# Patient Record
Sex: Male | Born: 1965 | Race: White | Hispanic: No | Marital: Married | Smoking: Former smoker
Health system: Southern US, Community
[De-identification: ages and names within clinical notes are randomized; demographics above are authoritative.]

## PROBLEM LIST (undated history)

## (undated) DIAGNOSIS — J449 Chronic obstructive pulmonary disease, unspecified: Secondary | ICD-10-CM

## (undated) DIAGNOSIS — K219 Gastro-esophageal reflux disease without esophagitis: Secondary | ICD-10-CM

## (undated) DIAGNOSIS — R0602 Shortness of breath: Secondary | ICD-10-CM

## (undated) DIAGNOSIS — Z87891 Personal history of nicotine dependence: Secondary | ICD-10-CM

## (undated) DIAGNOSIS — G43909 Migraine, unspecified, not intractable, without status migrainosus: Secondary | ICD-10-CM

## (undated) DIAGNOSIS — Z8249 Family history of ischemic heart disease and other diseases of the circulatory system: Secondary | ICD-10-CM

## (undated) DIAGNOSIS — M549 Dorsalgia, unspecified: Secondary | ICD-10-CM

## (undated) DIAGNOSIS — R911 Solitary pulmonary nodule: Secondary | ICD-10-CM

## (undated) DIAGNOSIS — R0600 Dyspnea, unspecified: Secondary | ICD-10-CM

## (undated) DIAGNOSIS — I2510 Atherosclerotic heart disease of native coronary artery without angina pectoris: Secondary | ICD-10-CM

## (undated) DIAGNOSIS — K661 Hemoperitoneum: Secondary | ICD-10-CM

## (undated) DIAGNOSIS — I700 Atherosclerosis of aorta: Secondary | ICD-10-CM

## (undated) HISTORY — DX: Gastro-esophageal reflux disease without esophagitis: K21.9

## (undated) HISTORY — DX: Hemoperitoneum: K66.1

## (undated) HISTORY — DX: Atherosclerosis of aorta: I70.0

## (undated) HISTORY — DX: Dyspnea, unspecified: R06.00

## (undated) HISTORY — DX: Family history of ischemic heart disease and other diseases of the circulatory system: Z82.49

## (undated) HISTORY — DX: Atherosclerotic heart disease of native coronary artery without angina pectoris: I25.10

## (undated) HISTORY — DX: Migraine, unspecified, not intractable, without status migrainosus: G43.909

## (undated) HISTORY — DX: Solitary pulmonary nodule: R91.1

## (undated) HISTORY — DX: Chronic obstructive pulmonary disease, unspecified: J44.9

## (undated) HISTORY — DX: Personal history of nicotine dependence: Z87.891

## (undated) HISTORY — DX: Shortness of breath: R06.02

---

## 2005-07-27 ENCOUNTER — Emergency Department (HOSPITAL_COMMUNITY): Admission: EM | Admit: 2005-07-27 | Discharge: 2005-07-27 | Payer: Self-pay | Admitting: Family Medicine

## 2009-07-02 HISTORY — PX: INGUINAL HERNIA REPAIR: SUR1180

## 2009-12-15 ENCOUNTER — Emergency Department (HOSPITAL_COMMUNITY): Admission: EM | Admit: 2009-12-15 | Discharge: 2009-12-15 | Payer: Self-pay | Admitting: Emergency Medicine

## 2009-12-20 ENCOUNTER — Encounter (INDEPENDENT_AMBULATORY_CARE_PROVIDER_SITE_OTHER): Payer: Self-pay | Admitting: General Surgery

## 2009-12-20 ENCOUNTER — Inpatient Hospital Stay (HOSPITAL_COMMUNITY): Admission: AD | Admit: 2009-12-20 | Discharge: 2009-12-22 | Payer: Self-pay | Admitting: General Surgery

## 2010-09-17 LAB — DIFFERENTIAL
Basophils Absolute: 0 10*3/uL (ref 0.0–0.1)
Monocytes Absolute: 0.8 10*3/uL (ref 0.1–1.0)
Monocytes Relative: 10 % (ref 3–12)
Neutrophils Relative %: 81 % — ABNORMAL HIGH (ref 43–77)

## 2010-09-17 LAB — CBC
HCT: 40.6 % (ref 39.0–52.0)
Hemoglobin: 13.6 g/dL (ref 13.0–17.0)
Hemoglobin: 14.8 g/dL (ref 13.0–17.0)
MCHC: 34.5 g/dL (ref 30.0–36.0)
Platelets: 198 10*3/uL (ref 150–400)
Platelets: 363 10*3/uL (ref 150–400)
RBC: 4.48 MIL/uL (ref 4.22–5.81)
RBC: 4.93 MIL/uL (ref 4.22–5.81)
RDW: 12.9 % (ref 11.5–15.5)
WBC: 11 10*3/uL — ABNORMAL HIGH (ref 4.0–10.5)
WBC: 5.2 10*3/uL (ref 4.0–10.5)
WBC: 7.7 10*3/uL (ref 4.0–10.5)

## 2010-09-17 LAB — BASIC METABOLIC PANEL
BUN: 9 mg/dL (ref 6–23)
CO2: 30 mEq/L (ref 19–32)
Calcium: 9.2 mg/dL (ref 8.4–10.5)
Calcium: 9.3 mg/dL (ref 8.4–10.5)
Creatinine, Ser: 0.95 mg/dL (ref 0.4–1.5)
Creatinine, Ser: 1.14 mg/dL (ref 0.4–1.5)
GFR calc Af Amer: >60 mL/min (ref >60)
GFR calc Af Amer: >60 mL/min (ref >60)
GFR calc non Af Amer: >60 mL/min (ref >60)
Glucose, Bld: 102 mg/dL — ABNORMAL HIGH (ref 70–99)
Glucose, Bld: 108 mg/dL — ABNORMAL HIGH (ref 70–99)
Glucose, Bld: 110 mg/dL — ABNORMAL HIGH (ref 70–99)
Potassium: 4.5 mEq/L (ref 3.5–5.1)
Potassium: 4.8 mEq/L (ref 3.5–5.1)

## 2010-09-17 LAB — ANAEROBIC CULTURE: Gram Stain: NONE SEEN

## 2010-09-17 LAB — BODY FLUID CULTURE: Culture: NO GROWTH

## 2011-05-30 LAB — CBC
HEMOGLOBIN: 14.5 g/dL
PLATELET COUNT: 241
WBC: 5.6

## 2011-05-30 LAB — COMPREHENSIVE METABOLIC PANEL
ALT: 25
AST: 19 U/L
Alkaline Phosphatase: 51 U/L
BILIRUBIN: 0.9
CREATININE: 1.12
GLUCOSE: 76
Potassium: 4.4 mmol/L
SODIUM: 140

## 2011-05-30 LAB — LIPID PANEL
CHOLESTEROL, TOTAL: 174
HDL: 37 mg/dL (ref 35–70)
LDL (calc): 116
TRIGLYCERIDES: 107

## 2011-05-30 LAB — TSH
Free T4: 2.9
TSH: 1.288

## 2011-05-30 LAB — PSA: PSA: 0.48

## 2011-05-30 LAB — VITAMIN D 25 HYDROXY (VIT D DEFICIENCY, FRACTURES): Vit D, 25-Hydroxy: 70

## 2014-09-17 ENCOUNTER — Ambulatory Visit (INDEPENDENT_AMBULATORY_CARE_PROVIDER_SITE_OTHER): Payer: BLUE CROSS/BLUE SHIELD | Admitting: Family Medicine

## 2014-09-17 ENCOUNTER — Encounter: Payer: Self-pay | Admitting: Family Medicine

## 2014-09-17 VITALS — BP 122/82 | HR 64 | Temp 97.9°F | Ht 74.0 in | Wt 228.8 lb

## 2014-09-17 DIAGNOSIS — J209 Acute bronchitis, unspecified: Secondary | ICD-10-CM | POA: Insufficient documentation

## 2014-09-17 DIAGNOSIS — G43909 Migraine, unspecified, not intractable, without status migrainosus: Secondary | ICD-10-CM

## 2014-09-17 DIAGNOSIS — L918 Other hypertrophic disorders of the skin: Secondary | ICD-10-CM

## 2014-09-17 DIAGNOSIS — K219 Gastro-esophageal reflux disease without esophagitis: Secondary | ICD-10-CM | POA: Insufficient documentation

## 2014-09-17 DIAGNOSIS — M546 Pain in thoracic spine: Secondary | ICD-10-CM

## 2014-09-17 MED ORDER — NAPROXEN 500 MG PO TABS: ORAL_TABLET | ORAL | Status: DC

## 2014-09-17 MED ORDER — TRAMADOL HCL 50 MG PO TABS: 50.0000 mg | ORAL_TABLET | Freq: Three times a day (TID) | ORAL | Status: DC | PRN

## 2014-09-17 MED ORDER — AZITHROMYCIN 250 MG PO TABS: ORAL_TABLET | ORAL | Status: DC

## 2014-09-17 MED ORDER — SUMATRIPTAN SUCCINATE 100 MG PO TABS: 100.0000 mg | ORAL_TABLET | ORAL | Status: DC | PRN

## 2014-09-17 MED ORDER — RANITIDINE HCL 150 MG PO TABS: 150.0000 mg | ORAL_TABLET | Freq: Every day | ORAL | Status: DC

## 2014-09-17 NOTE — Assessment & Plan Note (Signed)
Continue ranitidine prn. Discussed diet changes to control GERD sxs.

## 2014-09-17 NOTE — Progress Notes (Signed)
BP 122/82 mmHg  Pulse 64  Temp(Src) 97.9 F (36.6 C) (Oral)  Ht 6\' 2"  (1.88 m)  Wt 228 lb 12 oz (103.76 kg)  BMI 29.36 kg/m2   CC: new pt to establish care  Subjective:    Patient ID: David Moss, male    DOB: 10/10/1965, 49 y.o.   MRN: [id number]  HPI: David Moss is a 49 y.o. male presenting on 09/17/2014 for Establish Care   Prior saw Dr Derry Lory who moved to [place].  GERD with indigestion - on zantac 150mg  nightly as needed which controls sxs well.   Migraines - once a month. imitrex helps significantly.   Thoracic back pain - rare use of percocets for thoracic back pain prescribed by prior PCP. Back bothersome about once a month. Ibuporfen didn't really help back pain.  Ongoing cold for last 3.5 weeks - chest congestion, cough mildly productive of mucous, sinus congestion, PNdrainage. No fevers/chills, ear pain, tooth pain, ST. No smokers at home. No h/o asthma.  Wife and daughter initially better. Has tried mucinex DM x2 packs, delsym, sinus decongestant, liquor.   Also has several skin tags - axilla, around eyes, one on back - that he wants to have removed. Requests derm referral.  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 Interim medical history since our last visit reviewed. Allergies and medications reviewed and updated. No current outpatient prescriptions on file prior to visit.   No current facility-administered medications on file prior to visit.    Review of Systems Per HPI unless specifically indicated above     Objective:    BP 122/82 mmHg  Pulse 64  Temp(Src) 97.9 F (36.6 C) (Oral)  Ht 6\' 2"  (1.88 m)  Wt 228 lb 12 oz (103.76 kg)  BMI 29.36 kg/m2  Wt Readings from Last 3 Encounters:  09/17/14  228 lb 12 oz (103.76 kg)    Physical Exam  Constitutional: He appears well-developed and well-nourished. No distress.  HENT:  Mouth/Throat: Oropharynx is clear and moist. No oropharyngeal exudate.  Eyes: Conjunctivae are normal. Pupils are equal, round, and reactive to light.  Neck: Normal range of motion. Neck supple. No thyromegaly present.  Cardiovascular: Normal rate, regular rhythm, normal heart sounds and intact distal pulses.   No murmur heard. Pulmonary/Chest: Effort normal and breath sounds normal. No respiratory distress. He has no wheezes. He has no rales.  Musculoskeletal: He exhibits no edema.  No midline or paraspinal tenderness thoracic back  Lymphadenopathy:    He has no cervical adenopathy.  Skin: Skin is warm and dry. No rash noted.  Several skin tags axillary region, miniscule tag R lateral and nasal periorbital region One larger skin growth on back  Psychiatric: He has a normal mood and affect.  Nursing note and vitals reviewed.     Assessment & Plan:   Problem List Items Addressed This Visit    Thoracic back pain    Endorses chronic issue with thoracic back pain that causes intermittent flares of pain. Prescribed percocet by prior PCP. Exam WNL today. Discussed naprosyn use with tramadol for breakthrough pain at next flare. Consider further eval with imaging if persistent discomfort.      Relevant Medications   naproxen (NAPROSYN) tablet   traMADol (ULTRAM) tablet 50 mg  Skin tag    Given tags around eyes will refer to derm for eval. 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      Relevant Orders   Ambulatory referral to Dermatology   Migraine    Refilled sumatriptan.      Relevant Medications   SUMAtriptan (IMITREX) tablet   naproxen (NAPROSYN) tablet   traMADol (ULTRAM) tablet 50 mg   GERD (gastroesophageal reflux disease) - Primary    Continue ranitidine prn. Discussed diet changes to control GERD sxs.      Relevant Medications   ranitidine (ZANTAC)  tablet   Acute bronchitis    Ongoing cough for 3.5 wks - post infectious cough vs bronchitis. Still mildly productive of sputum, lung exam with harsh coarse cough. Cover for atypicals with zpack.  Supportive care as per instructions.           Follow up plan: Return in about 3 months (around 12/18/2014), or as needed, for annual exam, prior fasting for blood work.

## 2014-09-17 NOTE — Assessment & Plan Note (Signed)
Endorses chronic issue with thoracic back pain that causes intermittent flares of pain. Prescribed percocet by prior PCP. Exam WNL today. Discussed naprosyn use with tramadol for breakthrough pain at next flare. Consider further eval with imaging if persistent discomfort.

## 2014-09-17 NOTE — Assessment & Plan Note (Signed)
Refilled sumatriptan 

## 2014-09-17 NOTE — Patient Instructions (Addendum)
I've refilled zantac and imitrex. For thoracic back pain - try naprosyn (with food) and tramadol for breakthrough pain.  Let us know how this works. For cough - I think you have bronchitis. treat with zpack. You can also use naprosyn prescribed as anti inflammatory for cough. Push fluids and rest. Return as needed or in 3-4 months for annual exam , prior fasting for blood work

## 2014-09-17 NOTE — Progress Notes (Signed)
Pre visit review using our clinic review tool, if applicable. No additional management support is needed unless otherwise documented below in the visit note. 

## 2014-09-17 NOTE — Assessment & Plan Note (Signed)
Given tags around eyes will refer to derm for eval. 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

## 2014-09-17 NOTE — Assessment & Plan Note (Signed)
Ongoing cough for 3.5 wks - post infectious cough vs bronchitis. Still mildly productive of sputum, lung exam with harsh coarse cough. Cover for atypicals with zpack.  Supportive care as per instructions.

## 2014-10-01 ENCOUNTER — Telehealth: Payer: Self-pay | Admitting: *Deleted

## 2014-10-01 MED ORDER — AMOXICILLIN-POT CLAVULANATE 875-125 MG PO TABS: 1.0000 | ORAL_TABLET | Freq: Two times a day (BID) | ORAL | Status: AC

## 2014-10-01 NOTE — Telephone Encounter (Signed)
Pt states he was seen 2 weeks ago for chest congestion, and given z pak. He said he's "not any better and now it's all moved up into my head". He c/o sinus pressure, headache, productive cough- yellow sputum, and a sore throat, nut denies running a fever. He request a different abx be called in to [facility]. Please advise?

## 2014-10-01 NOTE — Telephone Encounter (Signed)
Patient notified and verbalized understanding. 

## 2014-10-01 NOTE — Telephone Encounter (Signed)
Would recommend treatment with augmentin course to cover sinusitis. Given how long sxs ongoing, recommend extended 2 wk course. Update if not improved after this.

## 2014-10-12 ENCOUNTER — Other Ambulatory Visit: Payer: Self-pay | Admitting: Dermatology

## 2014-11-10 ENCOUNTER — Encounter: Payer: Self-pay | Admitting: *Deleted

## 2014-12-31 ENCOUNTER — Other Ambulatory Visit (INDEPENDENT_AMBULATORY_CARE_PROVIDER_SITE_OTHER): Payer: BLUE CROSS/BLUE SHIELD

## 2014-12-31 ENCOUNTER — Other Ambulatory Visit: Payer: Self-pay | Admitting: Family Medicine

## 2014-12-31 DIAGNOSIS — Z131 Encounter for screening for diabetes mellitus: Secondary | ICD-10-CM | POA: Diagnosis not present

## 2014-12-31 DIAGNOSIS — Z13220 Encounter for screening for lipoid disorders: Secondary | ICD-10-CM | POA: Diagnosis not present

## 2014-12-31 DIAGNOSIS — G43909 Migraine, unspecified, not intractable, without status migrainosus: Secondary | ICD-10-CM

## 2014-12-31 LAB — BASIC METABOLIC PANEL
BUN: 18 mg/dL (ref 6–23)
CHLORIDE: 103 meq/L (ref 96–112)
CO2: 28 mEq/L (ref 19–32)
Calcium: 9.3 mg/dL (ref 8.4–10.5)
Creatinine, Ser: 1.21 mg/dL (ref 0.40–1.50)
GFR: 68.3 mL/min (ref >60.00)
Glucose, Bld: 95 mg/dL (ref 70–99)
POTASSIUM: 4 meq/L (ref 3.5–5.1)
SODIUM: 140 meq/L (ref 135–145)

## 2014-12-31 LAB — LIPID PANEL
Cholesterol: 175 mg/dL (ref 0–200)
HDL: 47.9 mg/dL (ref >39.00)
LDL Cholesterol: 118 mg/dL — ABNORMAL HIGH (ref 0–99)
NonHDL: 127.1
Total CHOL/HDL Ratio: 4
Triglycerides: 47 mg/dL (ref 0.0–149.0)
VLDL: 9.4 mg/dL (ref 0.0–40.0)

## 2014-12-31 LAB — TSH: TSH: 1.39 u[IU]/mL (ref 0.35–4.50)

## 2015-01-07 ENCOUNTER — Ambulatory Visit (INDEPENDENT_AMBULATORY_CARE_PROVIDER_SITE_OTHER): Payer: BLUE CROSS/BLUE SHIELD | Admitting: Family Medicine

## 2015-01-07 ENCOUNTER — Encounter: Payer: Self-pay | Admitting: Family Medicine

## 2015-01-07 VITALS — BP 110/84 | HR 60 | Temp 98.0°F | Ht 74.0 in | Wt 231.0 lb

## 2015-01-07 DIAGNOSIS — K219 Gastro-esophageal reflux disease without esophagitis: Secondary | ICD-10-CM | POA: Diagnosis not present

## 2015-01-07 DIAGNOSIS — Z0000 Encounter for general adult medical examination without abnormal findings: Secondary | ICD-10-CM | POA: Diagnosis not present

## 2015-01-07 DIAGNOSIS — Z23 Encounter for immunization: Secondary | ICD-10-CM | POA: Diagnosis not present

## 2015-01-07 DIAGNOSIS — R2240 Localized swelling, mass and lump, unspecified lower limb: Secondary | ICD-10-CM | POA: Insufficient documentation

## 2015-01-07 DIAGNOSIS — R2241 Localized swelling, mass and lump, right lower limb: Secondary | ICD-10-CM | POA: Diagnosis not present

## 2015-01-07 NOTE — Progress Notes (Signed)
Pre visit review using our clinic review tool, if applicable. No additional management support is needed unless otherwise documented below in the visit note. 

## 2015-01-07 NOTE — Assessment & Plan Note (Signed)
Preventative protocols reviewed and updated unless pt declined. Discussed healthy diet and lifestyle.  

## 2015-01-07 NOTE — Assessment & Plan Note (Signed)
Prn zantac

## 2015-01-07 NOTE — Assessment & Plan Note (Signed)
Anticipate lipoma. Pt will continue to monitor.

## 2015-01-07 NOTE — Patient Instructions (Addendum)
Tdap today Good to see you today, call us with questions. Return as needed or in 1 year for next physical.

## 2015-01-07 NOTE — Addendum Note (Signed)
Addended by: Royann Shivers A on: 01/07/2015 09:23 AM   Modules accepted: Orders

## 2015-01-07 NOTE — Progress Notes (Signed)
BP 110/84 mmHg  Pulse 60  Temp(Src) 98 F (36.7 C) (Oral)  Ht 6\' 2"  (1.88 m)  Wt 231 lb (104.781 kg)  BMI 29.65 kg/m2   CC: CPE  Subjective:    Patient ID: David Moss, male    DOB: 07-19-1965, 49 y.o.   MRN: [id number]  HPI: David Moss is a 49 y.o. male presenting on 01/07/2015 for Annual Exam   Some chronic thoracic pain attributed to arthritis and possible pinching nerve. On naprosyn and tramadol for this.   Knot on leg which may be enlarging. Not tender or itchy. Present for 1.5 years.   Preventative: No recent CPE Gets flu shot yearly. Tetanus - >10 yrs ago, requests today Seat belt use discussed Sunscreen use discussed. No changing moles on skin.  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 Interim medical history since our last visit reviewed. Allergies and medications reviewed and updated. Current Outpatient Prescriptions on File Prior to Visit  Medication Sig  . naproxen (NAPROSYN) 500 MG tablet Take one po bid x 1 week then prn pain, take with food  . ranitidine (ZANTAC) 150 MG tablet Take 1 tablet (150 mg total) by mouth at bedtime.  . SUMAtriptan (IMITREX) 100 MG tablet Take 1 tablet (100 mg total) by mouth every 2 (two) hours as needed for migraine. May repeat in 2 hours if headache persists or recurs.  . traMADol (ULTRAM) 50 MG tablet Take 1 tablet (50 mg total) by mouth every 8 (eight) hours as needed.   No current facility-administered medications on file prior to visit.    Review of Systems  Constitutional: Negative for fever, chills, activity change, appetite change, fatigue and unexpected weight change.  HENT: Negative for hearing loss.   Eyes: Negative for visual disturbance.  Respiratory: Negative for cough, chest tightness,  shortness of breath and wheezing.   Cardiovascular: Negative for chest pain, palpitations and leg swelling.  Gastrointestinal: Negative for nausea, vomiting, abdominal pain, diarrhea, constipation, blood in stool and abdominal distention.  Genitourinary: Negative for hematuria and difficulty urinating.  Musculoskeletal: Negative for myalgias, arthralgias and neck pain.  Skin: Negative for rash.  Neurological: Negative for dizziness, seizures, syncope and headaches.  Hematological: Negative for adenopathy. Does not bruise/bleed easily.  Psychiatric/Behavioral: Negative for dysphoric mood. The patient is not nervous/anxious.    Per HPI unless specifically indicated above     Objective:    BP 110/84 mmHg  Pulse 60  Temp(Src) 98 F (36.7 C) (Oral)  Ht 6\' 2"  (1.88 m)  Wt 231 lb (104.781 kg)  BMI 29.65 kg/m2  Wt Readings from Last 3 Encounters:  01/07/15 231 lb (104.781 kg)  09/17/14 228 lb 12 oz (103.76 kg)    Physical Exam  Constitutional: He is oriented to person, place, and time. He appears well-developed and well-nourished. No distress.  HENT:  Head: Normocephalic and atraumatic.  Right Ear: Hearing, tympanic membrane, external ear and ear canal normal.  Left Ear: Hearing, tympanic membrane, external ear and ear canal normal.  Nose: Nose normal.  Mouth/Throat: Uvula is midline, oropharynx is clear and moist and mucous membranes are normal. No oropharyngeal exudate, posterior oropharyngeal edema or posterior oropharyngeal erythema.  Eyes: Conjunctivae and EOM are normal. Pupils are equal, round, and reactive to light. No scleral icterus.  Neck: Normal range  of motion. Neck supple.  Cardiovascular: Normal rate, regular rhythm, normal heart sounds and intact distal pulses.   No murmur heard. Pulses:      Radial pulses are 2+ on the right side, and 2+ on the left side.  Pulmonary/Chest: Effort normal and breath sounds normal. No respiratory distress. He has no wheezes. He has no  rales.  Abdominal: Soft. Bowel sounds are normal. He exhibits no distension and no mass. There is no tenderness. There is no rebound and no guarding.  Musculoskeletal: Normal range of motion. He exhibits no edema.  Lymphadenopathy:    He has no cervical adenopathy.  Neurological: He is alert and oriented to person, place, and time.  CN grossly intact, station and gait intact  Skin: Skin is warm and dry. No rash noted.  ~2cm small firm mobile lump R anterior upper thigh, nontender or erythematous or fluctuant  Psychiatric: He has a normal mood and affect. His behavior is normal. Judgment and thought content normal.  Nursing note and vitals reviewed.  Results for orders placed or performed in visit on 12/31/14  Lipid panel  Result Value Ref Range   Cholesterol 175 0 - 200 mg/dL   Triglycerides 47.0 0.0 - 149.0 mg/dL   HDL 47.90 >39.00 mg/dL   VLDL 9.4 0.0 - 40.0 mg/dL   LDL Cholesterol 118 (H) 0 - 99 mg/dL   Total CHOL/HDL Ratio 4    NonHDL 938.18   Basic metabolic panel  Result Value Ref Range   Sodium 140 135 - 145 mEq/L   Potassium 4.0 3.5 - 5.1 mEq/L   Chloride 103 96 - 112 mEq/L   CO2 28 19 - 32 mEq/L   Glucose, Bld 95 70 - 99 mg/dL   BUN 18 6 - 23 mg/dL   Creatinine, Ser 1.21 0.40 - 1.50 mg/dL   Calcium 9.3 8.4 - 10.5 mg/dL   GFR 68.30 >60.00 mL/min  TSH  Result Value Ref Range   TSH 1.39 0.35 - 4.50 uIU/mL      Assessment & Plan:   Problem List Items Addressed This Visit    GERD (gastroesophageal reflux disease)    Prn zantac.      Health maintenance examination - Primary    Preventative protocols reviewed and updated unless pt declined. Discussed healthy diet and lifestyle.       Skin lump of leg    Anticipate lipoma. Pt will continue to monitor.          Follow up plan: Return in about 1 year (around 01/07/2016), or as needed, for annual exam, prior fasting for blood work.

## 2015-03-14 ENCOUNTER — Ambulatory Visit (INDEPENDENT_AMBULATORY_CARE_PROVIDER_SITE_OTHER): Payer: BLUE CROSS/BLUE SHIELD | Admitting: Family Medicine

## 2015-03-14 ENCOUNTER — Ambulatory Visit (INDEPENDENT_AMBULATORY_CARE_PROVIDER_SITE_OTHER)
Admission: RE | Admit: 2015-03-14 | Discharge: 2015-03-14 | Disposition: A | Discharge status: Home / Self Care | Payer: BLUE CROSS/BLUE SHIELD | Source: Ambulatory Visit | Attending: Family Medicine | Admitting: Family Medicine

## 2015-03-14 ENCOUNTER — Encounter: Payer: Self-pay | Admitting: Family Medicine

## 2015-03-14 VITALS — BP 116/78 | HR 60 | Temp 98.2°F | Wt 232.5 lb

## 2015-03-14 DIAGNOSIS — Z23 Encounter for immunization: Secondary | ICD-10-CM | POA: Diagnosis not present

## 2015-03-14 DIAGNOSIS — M25561 Pain in right knee: Secondary | ICD-10-CM | POA: Insufficient documentation

## 2015-03-14 MED ORDER — HYDROCODONE-ACETAMINOPHEN 5-325 MG PO TABS: 1.0000 | ORAL_TABLET | Freq: Three times a day (TID) | ORAL | Status: DC | PRN

## 2015-03-14 NOTE — Patient Instructions (Addendum)
I'm worried about medial knee ligament or meniscal tear of right knee. Xrays today. Continue naprosyn 500mg  twice daily with food. May use hydrocodone (vicodin) for breakthrough pain  Pass by [facility]'s office for referral to orthopedic office.

## 2015-03-14 NOTE — Progress Notes (Signed)
BP 116/78 mmHg  Pulse 60  Temp(Src) 98.2 F (36.8 C) (Oral)  Wt 232 lb 8 oz (105.461 kg)   CC: R knee swelling  Subjective:    Patient ID: David Moss, male    DOB: Aug 05, 1965, 50 y.o.   MRN: [id number]  HPI: David Moss is a 49 y.o. male presenting on 03/14/2015 for Knee Pain   2 wk h/o R knee swelling without recalled trauma. Has been self treating with ice, naprosyn 500mg  bid, tramadol for breakthrough pain. Denies inciting trauma/injury. 2 wks ago and again last week tweaked knee - swollen and tender since then.   No knee locking. + instability. No redness or warmth of knee. Denies fevers/chills.   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   No prior knee surgeries.   Relevant past medical, surgical, family and social history reviewed and updated as indicated. Interim medical history since our last visit reviewed. Allergies and medications reviewed and updated. Current Outpatient Prescriptions on File Prior to Visit  Medication Sig  . ranitidine (ZANTAC) 150 MG tablet Take 1 tablet (150 mg total) by mouth at bedtime.  . SUMAtriptan (IMITREX) 100 MG tablet Take 1 tablet (100 mg total) by mouth every 2 (two) hours as needed for migraine. May repeat in 2 hours if headache persists or recurs.  . traMADol (ULTRAM) 50 MG tablet Take 1 tablet (50 mg total) by mouth every 8 (eight) hours as needed.  . naproxen (NAPROSYN) 500 MG tablet Take one po bid x 1 week then prn pain, take with food   No current facility-administered medications on file prior to visit.    Review of Systems Per HPI unless specifically indicated above     Objective:    BP 116/78 mmHg  Pulse 60  Temp(Src) 98.2 F (36.8 C) (Oral)  Wt 232 lb 8 oz (105.461 kg)  Wt Readings from Last 3 Encounters:  03/14/15 232 lb 8 oz (105.461 kg)  01/07/15 231 lb (104.781 kg)  09/17/14 228 lb 12 oz (103.76 kg)    Physical Exam  Constitutional: He appears well-developed and well-nourished. No  distress.  Musculoskeletal: He exhibits edema (effusion present R knee ).  L knee WNL R Knee exam: Evident effusion ++ pain with palpation of anterior knee and medial joint line Limited ROM in flex/extension without crepitus 2/2 pain/swelling. + popliteal fullness. Painful drawer test but no cruciate ligament laxity appreciated. + mcmurray test. ++ pain with valgus stress. No pain with varus stress No PFgrind. No abnormal patellar mobility.   Skin: Skin is warm and dry. No rash noted.  Small abrasions bilateral lower legs but no obviously infected lesion  Nursing note and vitals reviewed.      Assessment & Plan:   Problem List Items Addressed This Visit    Right knee pain - Primary    I'm worried about medial knee ligament or meniscal tear of right knee. Less likely acute arthritis flare. Doubt gout as no erythema/warmth of joint. Xrays today. Continue naprosyn 500mg  twice daily with food. May use hydrocodone (vicodin) for breakthrough pain  Will refer to ortho for further eval/management.  Placed in knee immobilizer today.      Relevant Orders   DG Knee Complete 4 Views Right   Ambulatory referral to Orthopedic Surgery    Other Visit Diagnoses    Need for prophylactic vaccination and inoculation against influenza        Relevant Orders    Flu Vaccine QUAD 36+  mos PF IM (Fluarix & Fluzone Quad PF)        Follow up plan: Return if symptoms worsen or fail to improve.

## 2015-03-14 NOTE — Assessment & Plan Note (Signed)
I'm worried about medial knee ligament or meniscal tear of right knee. Less likely acute arthritis flare. Doubt gout as no erythema/warmth of joint. Xrays today. Continue naprosyn 500mg  twice daily with food. May use hydrocodone (vicodin) for breakthrough pain  Will refer to ortho for further eval/management.  Placed in knee immobilizer today.

## 2015-03-14 NOTE — Progress Notes (Signed)
Pre visit review using our clinic review tool, if applicable. No additional management support is needed unless otherwise documented below in the visit note. 

## 2015-03-16 ENCOUNTER — Other Ambulatory Visit: Payer: Self-pay | Admitting: Orthopaedic Surgery

## 2015-03-16 DIAGNOSIS — M25561 Pain in right knee: Secondary | ICD-10-CM

## 2015-03-26 ENCOUNTER — Ambulatory Visit
Admission: RE | Admit: 2015-03-26 | Discharge: 2015-03-26 | Disposition: A | Discharge status: Home / Self Care | Payer: BLUE CROSS/BLUE SHIELD | Source: Ambulatory Visit | Attending: Orthopaedic Surgery | Admitting: Orthopaedic Surgery

## 2015-03-26 DIAGNOSIS — M25561 Pain in right knee: Secondary | ICD-10-CM

## 2015-04-02 HISTORY — PX: MENISCUS REPAIR: SHX5179

## 2015-10-24 ENCOUNTER — Encounter: Payer: Self-pay | Admitting: Internal Medicine

## 2015-10-24 ENCOUNTER — Ambulatory Visit (INDEPENDENT_AMBULATORY_CARE_PROVIDER_SITE_OTHER): Payer: BLUE CROSS/BLUE SHIELD | Admitting: Internal Medicine

## 2015-10-24 VITALS — BP 110/72 | HR 62 | Temp 98.1°F | Wt 231.0 lb

## 2015-10-24 DIAGNOSIS — L03313 Cellulitis of chest wall: Secondary | ICD-10-CM | POA: Insufficient documentation

## 2015-10-24 MED ORDER — TRAMADOL HCL 50 MG PO TABS: 50.0000 mg | ORAL_TABLET | Freq: Three times a day (TID) | ORAL | Status: DC | PRN

## 2015-10-24 MED ORDER — CEPHALEXIN 500 MG PO CAPS: 500.0000 mg | ORAL_CAPSULE | Freq: Four times a day (QID) | ORAL | Status: DC

## 2015-10-24 MED ORDER — SUMATRIPTAN SUCCINATE 100 MG PO TABS: 100.0000 mg | ORAL_TABLET | Freq: Once | ORAL | Status: DC

## 2015-10-24 NOTE — Progress Notes (Signed)
Pre visit review using our clinic review tool, if applicable. No additional management support is needed unless otherwise documented below in the visit note. 

## 2015-10-24 NOTE — Progress Notes (Signed)
   Subjective:    Patient ID: David Moss, male    DOB: Apr 28, 1966, 50 y.o.   MRN: [id number]  HPI Here due to tick bite  Got bitten by tick 3 days ago Small tick Attached for 12 hours at most Wife got it off with rubbing alcohol and a q-tip  Since then, increasing redness there Some itching Not really painful  Current Outpatient Prescriptions on File Prior to Visit  Medication Sig Dispense Refill  . HYDROcodone-acetaminophen (NORCO/VICODIN) 5-325 MG per tablet Take 1 tablet by mouth 3 (three) times daily as needed for moderate pain. 20 tablet 0  . naproxen (NAPROSYN) 500 MG tablet Take one po bid x 1 week then prn pain, take with food 30 tablet 0  . ranitidine (ZANTAC) 150 MG tablet Take 1 tablet (150 mg total) by mouth at bedtime. 30 tablet 11  . SUMAtriptan (IMITREX) 100 MG tablet Take 1 tablet (100 mg total) by mouth every 2 (two) hours as needed for migraine. May repeat in 2 hours if headache persists or recurs. 10 tablet 3  . traMADol (ULTRAM) 50 MG tablet Take 1 tablet (50 mg total) by mouth every 8 (eight) hours as needed. 30 tablet 0   No current facility-administered medications on file prior to visit.    No Known Allergies  Past Medical History  Diagnosis Date  . GERD (gastroesophageal reflux disease)   . Migraine     Past Surgical History  Procedure Laterality Date  . Inguinal hernia repair Left 2011    [place]    Family History  Problem Relation Age of Onset  . Asthma Mother   . Migraines Mother   . Stroke Mother   . CAD Neg Hx   . Cancer Neg Hx   . Diabetes Neg Hx   . Hypertension Neg Hx     Social History   Social History  . Marital Status: Married    Spouse Name: N/A  . Number of Children: N/A  . Years of Education: N/A   Occupational History  . Not on file.   Social History Main Topics  . Smoking status: Never Smoker   . Smokeless tobacco: Former Systems developer    Types: Chew    Quit date: 07/03/2003     Comment: 15 yrs smokeless tobacco    . Alcohol Use: 0.0 oz/week    0 Standard drinks or equivalent per week     Comment: Social  . Drug Use: No  . Sexual Activity:    Partners: Female   Other Topics Concern  . Not on file   Social History Narrative   Lives with wife and daughter, 1 dog   Occupation: Biomedical scientist, farmer   Edu: HS   Activity: stays active at work and on farm   Diet: good water, fruits/vegetables daily   Review of Systems No fever No headache No other rash No joint swelling No vision changes    Objective:   Physical Exam  Constitutional: He appears well-developed and well-nourished. No distress.  Skin:  Tick site just lateral and below right breast Redness, warmth note ~3cm around entry wound. No ECM No other rash          Assessment & Plan:

## 2015-10-24 NOTE — Patient Instructions (Signed)
Please call if the infection isn't improving by Wednesday

## 2015-10-24 NOTE — Assessment & Plan Note (Signed)
After tick bite No evidence of specific tick borne infection Will treat with cephalexin

## 2015-11-14 ENCOUNTER — Encounter (HOSPITAL_COMMUNITY): Payer: Self-pay | Admitting: *Deleted

## 2015-11-14 ENCOUNTER — Emergency Department (HOSPITAL_COMMUNITY)
Admission: EM | Admit: 2015-11-14 | Discharge: 2015-11-14 | Disposition: A | Discharge status: Home / Self Care | Payer: BLUE CROSS/BLUE SHIELD | Attending: Emergency Medicine | Admitting: Emergency Medicine

## 2015-11-14 DIAGNOSIS — Y998 Other external cause status: Secondary | ICD-10-CM | POA: Diagnosis not present

## 2015-11-14 DIAGNOSIS — Z792 Long term (current) use of antibiotics: Secondary | ICD-10-CM | POA: Diagnosis not present

## 2015-11-14 DIAGNOSIS — K219 Gastro-esophageal reflux disease without esophagitis: Secondary | ICD-10-CM | POA: Diagnosis not present

## 2015-11-14 DIAGNOSIS — S0101XA Laceration without foreign body of scalp, initial encounter: Secondary | ICD-10-CM | POA: Insufficient documentation

## 2015-11-14 DIAGNOSIS — W228XXA Striking against or struck by other objects, initial encounter: Secondary | ICD-10-CM | POA: Diagnosis not present

## 2015-11-14 DIAGNOSIS — Y9289 Other specified places as the place of occurrence of the external cause: Secondary | ICD-10-CM | POA: Insufficient documentation

## 2015-11-14 DIAGNOSIS — G43909 Migraine, unspecified, not intractable, without status migrainosus: Secondary | ICD-10-CM | POA: Insufficient documentation

## 2015-11-14 DIAGNOSIS — S0990XA Unspecified injury of head, initial encounter: Secondary | ICD-10-CM | POA: Diagnosis present

## 2015-11-14 DIAGNOSIS — Y9389 Activity, other specified: Secondary | ICD-10-CM | POA: Insufficient documentation

## 2015-11-14 MED ORDER — LIDOCAINE HCL (PF) 1 % IJ SOLN
2.0000 mL | Freq: Once | INTRAMUSCULAR | Status: AC
  Administered 2015-11-14: 2 mL
  Filled 2015-11-14: qty 5
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
## 2015-11-14 NOTE — Discharge Instructions (Signed)
Stitches, Staples, or Adhesive Wound Closure  °Health care providers use stitches (sutures), staples, and certain glue (skin adhesives) to hold skin together while it heals (wound closure). You may need this treatment after you have surgery or if you cut your skin accidentally. These methods help your skin to heal more quickly and make it less likely that you will have a scar. A wound may take several months to heal completely.  °The type of wound you have determines when your wound gets closed. In most cases, the wound is closed as soon as possible (primary skin closure). Sometimes, closure is delayed so the wound can be cleaned and allowed to heal naturally. This reduces the chance of infection. Delayed closure may be needed if your wound:  °Is caused by a bite.  °Happened more than 6 hours ago.  °Involves loss of skin or the tissues under the skin.  °Has dirt or debris in it that cannot be removed.  °Is infected. °WHAT ARE THE DIFFERENT KINDS OF WOUND CLOSURES?  °There are many options for wound closure. The one that your health care provider uses depends on how deep and how large your wound is.  °Adhesive Glue  °To use this type of glue to close a wound, your health care provider holds the edges of the wound together and paints the glue on the surface of your skin. You may need more than one layer of glue. Then the wound may be covered with a light bandage (dressing).  °This type of skin closure may be used for small wounds that are not deep (superficial). Using glue for wound closure is less painful than other methods. It does not require a medicine that numbs the area (local anesthetic). This method also leaves nothing to be removed. Adhesive glue is often used for children and on facial wounds.  °Adhesive glue cannot be used for wounds that are deep, uneven, or bleeding. It is not used inside of a wound.  °Adhesive Strips  °These strips are made of sticky (adhesive), porous paper. They are applied across your  skin edges like a regular adhesive bandage. You leave them on until they fall off.  °Adhesive strips may be used to close very superficial wounds. They may also be used along with sutures to improve the closure of your skin edges.  °Sutures  °Sutures are the oldest method of wound closure. Sutures can be made from natural substances, such as silk, or from synthetic materials, such as nylon and steel. They can be made from a material that your body can break down as your wound heals (absorbable), or they can be made from a material that needs to be removed from your skin (nonabsorbable). They come in many different strengths and sizes.  °Your health care provider attaches the sutures to a steel needle on one end. Sutures can be passed through your skin, or through the tissues beneath your skin. Then they are tied and cut. Your skin edges may be closed in one continuous stitch or in separate stitches.  °Sutures are strong and can be used for all kinds of wounds. Absorbable sutures may be used to close tissues under the skin. The disadvantage of sutures is that they may cause skin reactions that lead to infection. Nonabsorbable sutures need to be removed.  °Staples  °When surgical staples are used to close a wound, the edges of your skin on both sides of the wound are brought close together. A staple is placed across the wound, and   an instrument secures the edges together. Staples are often used to close surgical cuts (incisions).  °Staples are faster to use than sutures, and they cause less skin reaction. Staples need to be removed using a tool that bends the staples away from your skin.  °HOW DO I CARE FOR MY WOUND CLOSURE?  °Take medicines only as directed by your health care provider.  °If you were prescribed an antibiotic medicine for your wound, finish it all even if you start to feel better.  °Use ointments or creams only as directed by your health care provider.  °Wash your hands with soap and water before and  after touching your wound.  °Do not soak your wound in water. Do not take baths, swim, or use a hot tub until your health care provider approves.  °Ask your health care provider when you can start showering. Cover your wound if directed by your health care provider.  °Do not take out your own sutures or staples.  °Do not pick at your wound. Picking can cause an infection.  °Keep all follow-up visits as directed by your health care provider. This is important. °HOW LONG WILL I HAVE MY WOUND CLOSURE?  °Leave adhesive glue on your skin until the glue peels away.  °Leave adhesive strips on your skin until the strips fall off.  °Absorbable sutures will dissolve within several days.  °Nonabsorbable sutures and staples must be removed. The location of the wound will determine how long they stay in. This can range from several days to a couple of weeks. °WHEN SHOULD I SEEK HELP FOR MY WOUND CLOSURE?  °Contact your health care provider if:  °You have a fever.  °You have chills.  °You have drainage, redness, swelling, or pain at your wound.  °There is a bad smell coming from your wound.  °The skin edges of your wound start to separate after your sutures have been removed.  °Your wound becomes thick, raised, and darker in color after your sutures come out (scarring). °This information is not intended to replace advice given to you by your health care provider. Make sure you discuss any questions you have with your health care provider.  °Document Released: 03/13/2001 Document Revised: 07/09/2014 Document Reviewed: 11/25/2013  °Elsevier Interactive Patient Education ©2016 [employer]  ° °

## 2015-11-21 ENCOUNTER — Ambulatory Visit (INDEPENDENT_AMBULATORY_CARE_PROVIDER_SITE_OTHER): Payer: BLUE CROSS/BLUE SHIELD | Admitting: Family Medicine

## 2015-11-21 ENCOUNTER — Encounter: Payer: Self-pay | Admitting: Family Medicine

## 2015-11-21 VITALS — BP 108/68 | HR 65 | Temp 98.2°F | Ht 74.0 in | Wt 224.8 lb

## 2015-11-21 DIAGNOSIS — M546 Pain in thoracic spine: Secondary | ICD-10-CM | POA: Diagnosis not present

## 2015-11-21 DIAGNOSIS — S0101XD Laceration without foreign body of scalp, subsequent encounter: Secondary | ICD-10-CM | POA: Diagnosis not present

## 2015-11-21 DIAGNOSIS — S0101XA Laceration without foreign body of scalp, initial encounter: Secondary | ICD-10-CM | POA: Insufficient documentation

## 2015-11-21 MED ORDER — CYCLOBENZAPRINE HCL 10 MG PO TABS: 10.0000 mg | ORAL_TABLET | Freq: Two times a day (BID) | ORAL | Status: DC | PRN

## 2015-11-21 NOTE — Assessment & Plan Note (Signed)
Staples x4 removed. Pt tolerated well. Aftercare discussed.

## 2015-11-21 NOTE — Assessment & Plan Note (Signed)
Anticipate R thoracic paraspinous mm spasm/strain. Treat with continued heating pad and trial flexeril. Discussed sedation precautions.  Hydrocodone caused HA - will remove this and tramadol from med list.

## 2015-11-21 NOTE — Progress Notes (Signed)
Pre visit review using our clinic review tool, if applicable. No additional management support is needed unless otherwise documented below in the visit note. 

## 2015-11-21 NOTE — Patient Instructions (Addendum)
Staples removed today Wash gently with soapy water, pat dry don't rub. For back pain -you're having spasming/strain of your thoracic paraspinous muscles. Try flexeril 5-10mg  as needed for back pain and may help migraines as well. Caution it can make you sleepy.
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
## 2016-01-05 ENCOUNTER — Other Ambulatory Visit: Payer: Self-pay | Admitting: Family Medicine

## 2016-01-05 DIAGNOSIS — E785 Hyperlipidemia, unspecified: Secondary | ICD-10-CM

## 2016-01-06 ENCOUNTER — Other Ambulatory Visit: Payer: BLUE CROSS/BLUE SHIELD

## 2016-01-13 ENCOUNTER — Encounter: Payer: BLUE CROSS/BLUE SHIELD | Admitting: Family Medicine

## 2016-02-10 ENCOUNTER — Encounter: Payer: BLUE CROSS/BLUE SHIELD | Admitting: Family Medicine

## 2016-02-24 ENCOUNTER — Encounter: Payer: Self-pay | Admitting: Internal Medicine

## 2016-02-24 ENCOUNTER — Ambulatory Visit (INDEPENDENT_AMBULATORY_CARE_PROVIDER_SITE_OTHER): Payer: BLUE CROSS/BLUE SHIELD | Admitting: Family Medicine

## 2016-02-24 ENCOUNTER — Encounter: Payer: Self-pay | Admitting: Family Medicine

## 2016-02-24 VITALS — BP 128/86 | HR 60 | Temp 98.1°F | Ht 74.0 in | Wt 229.0 lb

## 2016-02-24 DIAGNOSIS — Z23 Encounter for immunization: Secondary | ICD-10-CM | POA: Diagnosis not present

## 2016-02-24 DIAGNOSIS — R1111 Vomiting without nausea: Secondary | ICD-10-CM | POA: Diagnosis not present

## 2016-02-24 DIAGNOSIS — G43909 Migraine, unspecified, not intractable, without status migrainosus: Secondary | ICD-10-CM

## 2016-02-24 DIAGNOSIS — R1110 Vomiting, unspecified: Secondary | ICD-10-CM | POA: Insufficient documentation

## 2016-02-24 DIAGNOSIS — E785 Hyperlipidemia, unspecified: Secondary | ICD-10-CM

## 2016-02-24 DIAGNOSIS — Z0000 Encounter for general adult medical examination without abnormal findings: Secondary | ICD-10-CM | POA: Diagnosis not present

## 2016-02-24 LAB — COMPREHENSIVE METABOLIC PANEL
ALBUMIN: 4.4 g/dL (ref 3.5–5.2)
ALK PHOS: 43 U/L (ref 39–117)
ALT: 26 U/L (ref 0–53)
AST: 20 U/L (ref 0–37)
BILIRUBIN TOTAL: 0.7 mg/dL (ref 0.2–1.2)
BUN: 13 mg/dL (ref 6–23)
CALCIUM: 9 mg/dL (ref 8.4–10.5)
CO2: 28 meq/L (ref 19–32)
CREATININE: 0.99 mg/dL (ref 0.40–1.50)
Chloride: 104 mEq/L (ref 96–112)
GFR: 85.68 mL/min (ref >60.00)
Glucose, Bld: 97 mg/dL (ref 70–99)
Potassium: 4.2 mEq/L (ref 3.5–5.1)
Sodium: 138 mEq/L (ref 135–145)
Total Protein: 7.2 g/dL (ref 6.0–8.3)

## 2016-02-24 LAB — LIPASE: LIPASE: 33 U/L (ref 11.0–59.0)

## 2016-02-24 LAB — LIPID PANEL
CHOL/HDL RATIO: 4
CHOLESTEROL: 208 mg/dL — AB (ref 0–200)
HDL: 57.5 mg/dL (ref >39.00)
LDL Cholesterol: 136 mg/dL — ABNORMAL HIGH (ref 0–99)
NonHDL: 150.32
TRIGLYCERIDES: 73 mg/dL (ref 0.0–149.0)
VLDL: 14.6 mg/dL (ref 0.0–40.0)

## 2016-02-24 NOTE — Assessment & Plan Note (Signed)
Chronic, stable. Rare

## 2016-02-24 NOTE — Patient Instructions (Addendum)
Flu shot today. Labs today. We will refer you to GI in [facility] for further evaluation of am vomiting episodes. Schedule dentist as overdue.  Return as needed or in 1 year for next physical.  Health Maintenance, Male A healthy lifestyle and preventative care can promote health and wellness.  Maintain regular health, dental, and eye exams.  Eat a healthy diet. Foods like vegetables, fruits, whole grains, low-fat dairy products, and lean protein foods contain the nutrients you need and are low in calories. Decrease your intake of foods high in solid fats, added sugars, and salt. Get information about a proper diet from your health care provider, if necessary.  Regular physical exercise is one of the most important things you can do for your health. Most adults should get at least 150 minutes of moderate-intensity exercise (any activity that increases your heart rate and causes you to sweat) each week. In addition, most adults need muscle-strengthening exercises on 2 or more days a week.   Maintain a healthy weight. The body mass index (BMI) is a screening tool to identify possible weight problems. It provides an estimate of body fat based on height and weight. Your health care provider can find your BMI and can help you achieve or maintain a healthy weight. For males 20 years and older:  A BMI below 18.5 is considered underweight.  A BMI of 18.5 to 24.9 is normal.  A BMI of 25 to 29.9 is considered overweight.  A BMI of 30 and above is considered obese.  Maintain normal blood lipids and cholesterol by exercising and minimizing your intake of saturated fat. Eat a balanced diet with plenty of fruits and vegetables. Blood tests for lipids and cholesterol should begin at age 85 and be repeated every 5 years. If your lipid or cholesterol levels are high, you are over age 81, or you are at high risk for heart disease, you may need your cholesterol levels checked more frequently.Ongoing high  lipid and cholesterol levels should be treated with medicines if diet and exercise are not working.  If you smoke, find out from your health care provider how to quit. If you do not use tobacco, do not start.  Lung cancer screening is recommended for adults aged 41-80 years who are at high risk for developing lung cancer because of a history of smoking. A yearly low-dose CT scan of the lungs is recommended for people who have at least a 30-pack-year history of smoking and are current smokers or have quit within the past 15 years. A pack year of smoking is smoking an average of 1 pack of cigarettes a day for 1 year (for example, a 30-pack-year history of smoking could mean smoking 1 pack a day for 30 years or 2 packs a day for 15 years). Yearly screening should continue until the smoker has stopped smoking for at least 15 years. Yearly screening should be stopped for people who develop a health problem that would prevent them from having lung cancer treatment.  If you choose to drink alcohol, do not have more than 2 drinks per day. One drink is considered to be 12 oz (360 mL) of beer, 5 oz (150 mL) of wine, or 1.5 oz (45 mL) of liquor.  Avoid the use of street drugs. Do not share needles with anyone. Ask for help if you need support or instructions about stopping the use of drugs.  High blood pressure causes heart disease and increases the risk of stroke. High blood pressure  is more likely to develop in:  People who have blood pressure in the end of the normal range (100-139/85-89 mm Hg).  People who are overweight or obese.  People who are African American.  If you are 70-68 years of age, have your blood pressure checked every 3-5 years. If you are 60 years of age or older, have your blood pressure checked every year. You should have your blood pressure measured twice--once when you are at a hospital or clinic, and once when you are not at a hospital or clinic. Record the average of the two  measurements. To check your blood pressure when you are not at a hospital or clinic, you can use:  An automated blood pressure machine at a pharmacy.  A home blood pressure monitor.  If you are 43-28 years old, ask your health care provider if you should take aspirin to prevent heart disease.  Diabetes screening involves taking a blood sample to check your fasting blood sugar level. This should be done once every 3 years after age 53 if you are at a normal weight and without risk factors for diabetes. Testing should be considered at a younger age or be carried out more frequently if you are overweight and have at least 1 risk factor for diabetes.  Colorectal cancer can be detected and often prevented. Most routine colorectal cancer screening begins at the age of 24 and continues through age 28. However, your health care provider may recommend screening at an earlier age if you have risk factors for colon cancer. On a yearly basis, your health care provider may provide home test kits to check for hidden blood in the stool. A small camera at the end of a tube may be used to directly examine the colon (sigmoidoscopy or colonoscopy) to detect the earliest forms of colorectal cancer. Talk to your health care provider about this at age 80 when routine screening begins. A direct exam of the colon should be repeated every 5-10 years through age 82, unless early forms of precancerous polyps or small growths are found.  People who are at an increased risk for hepatitis B should be screened for this virus. You are considered at high risk for hepatitis B if:  You were born in a country where hepatitis B occurs often. Talk with your health care provider about which countries are considered high risk.  Your parents were born in a high-risk country and you have not received a shot to protect against hepatitis B (hepatitis B vaccine).  You have HIV or AIDS.  You use needles to inject street drugs.  You live  with, or have sex with, someone who has hepatitis B.  You are a man who has sex with other men (MSM).  You get hemodialysis treatment.  You take certain medicines for conditions like cancer, organ transplantation, and autoimmune conditions.  Hepatitis C blood testing is recommended for all people born from [birth year] through 1963 and any individual with known risk factors for hepatitis C.  Healthy men should no longer receive prostate-specific antigen (PSA) blood tests as part of routine cancer screening. Talk to your health care provider about prostate cancer screening.  Testicular cancer screening is not recommended for adolescents or adult males who have no symptoms. Screening includes self-exam, a health care provider exam, and other screening tests. Consult with your health care provider about any symptoms you have or any concerns you have about testicular cancer.  Practice safe sex. Use condoms and avoid high-risk  sexual practices to reduce the spread of sexually transmitted infections (STIs).  You should be screened for STIs, including gonorrhea and chlamydia if:  You are sexually active and are younger than 24 years.  You are older than 24 years, and your health care provider tells you that you are at risk for this type of infection.  Your sexual activity has changed since you were last screened, and you are at an increased risk for chlamydia or gonorrhea. Ask your health care provider if you are at risk.  If you are at risk of being infected with HIV, it is recommended that you take a prescription medicine daily to prevent HIV infection. This is called pre-exposure prophylaxis (PrEP). You are considered at risk if:  You are a man who has sex with other men (MSM).  You are a heterosexual man who is sexually active with multiple partners.  You take drugs by injection.  You are sexually active with a partner who has HIV.  Talk with your health care provider about whether you are at  high risk of being infected with HIV. If you choose to begin PrEP, you should first be tested for HIV. You should then be tested every 3 months for as long as you are taking PrEP.  Use sunscreen. Apply sunscreen liberally and repeatedly throughout the day. You should seek shade when your shadow is shorter than you. Protect yourself by wearing long sleeves, pants, a wide-brimmed hat, and sunglasses year round whenever you are outdoors.  Tell your health care provider of new moles or changes in moles, especially if there is a change in shape or color. Also, tell your health care provider if a mole is larger than the size of a pencil eraser.  A one-time screening for abdominal aortic aneurysm (AAA) and surgical repair of large AAAs by ultrasound is recommended for men aged 75-75 years who are current or former smokers.  Stay current with your vaccines (immunizations).   This information is not intended to replace advice given to you by your health care provider. Make sure you discuss any questions you have with your health care provider.   Document Released: 12/15/2007 Document Revised: 07/09/2014 Document Reviewed: 11/13/2010 Elsevier Interactive Patient Education [employer].

## 2016-02-24 NOTE — Assessment & Plan Note (Signed)
Endorses 2 mo h/o am vomiting episodes progressively worsening. No significant GERD sxs or other red flags. Will refer to GI to further eval for HH, gastroparesis, etc. Pt and wife agree with plan.

## 2016-02-24 NOTE — Addendum Note (Signed)
Addended by: Royann Shivers A on: 02/24/2016 11:34 AM   Modules accepted: Orders

## 2016-02-24 NOTE — Progress Notes (Signed)
BP 128/86   Pulse 60   Temp 98.1 F (36.7 C) (Oral)   Ht 6\' 2"  (1.88 m)   Wt 229 lb (103.9 kg)   BMI 29.40 kg/m    CC: CPE Subjective:    Patient ID: David Moss, male    DOB: [birth year], 50 y.o.   MRN: [id number]  HPI: Clearance David Moss is a 50 y.o. male presenting on 02/24/2016 for Annual Exam   Here with wife today.  Abd bloating and indigestion almost every morning over last few months progressively worsening. This leads to belching, gagging and emesis of prior night's meal. This happens prior to breakfast. NBNB emesis. Dinner is 5-7pm, bedtime 11pm. Denies fevers/chills, dysphagia, choking. Some early satiety. Some constipation. Notes the later he eats dinner the more likely he is to have vomiting event next morning. No unexpected weight loss. No significant GERD. No blood in stool or diarrhea.   Preventative: Flu shot yearly Tdap 2016 Seat belt use discussed Sunscreen use discussed. No changing moles on skin.  Non smoker. Continues chewing tobacco.  Alcohol - very rare   Lives with wife and daughter, 1 dog  Occupation: Biomedical scientist, farmer  Edu: HS  Activity: stays active at work and on farm  Diet: good water, fruits/vegetables daily  Relevant past medical, surgical, family and social history reviewed and updated as indicated. Interim medical history since our last visit reviewed. Allergies and medications reviewed and updated. Current Outpatient Prescriptions on File Prior to Visit  Medication Sig  . cyclobenzaprine (FLEXERIL) 10 MG tablet Take 1 tablet (10 mg total) by mouth 2 (two) times daily as needed for muscle spasms (sedation precautions).  . naproxen (NAPROSYN) 500 MG tablet Take one po bid x 1 week then prn pain, take with food  . SUMAtriptan (IMITREX) 100 MG tablet Take 1 tablet (100 mg total) by mouth once. May repeat in 2 hours if headache persists or recurs.  . ranitidine (ZANTAC) 150 MG tablet Take 1 tablet (150 mg total) by mouth at bedtime.  (Patient not taking: Reported on 02/24/2016)   No current facility-administered medications on file prior to visit.     Review of Systems  Constitutional: Negative for activity change, appetite change, chills, fatigue, fever and unexpected weight change.  HENT: Negative for hearing loss.   Eyes: Negative for visual disturbance.  Respiratory: Negative for cough, chest tightness, shortness of breath and wheezing.   Cardiovascular: Negative for chest pain, palpitations and leg swelling.  Gastrointestinal: Positive for abdominal distention, abdominal pain, constipation (intermittent), nausea and vomiting. Negative for blood in stool and diarrhea.       See HPI  Genitourinary: Negative for difficulty urinating and hematuria.  Musculoskeletal: Negative for arthralgias, myalgias and neck pain.  Skin: Negative for rash.  Neurological: Negative for dizziness, seizures, syncope and headaches.  Hematological: Negative for adenopathy. Does not bruise/bleed easily.  Psychiatric/Behavioral: Negative for dysphoric mood. The patient is not nervous/anxious.    Per HPI unless specifically indicated in ROS section     Objective:    BP 128/86   Pulse 60   Temp 98.1 F (36.7 C) (Oral)   Ht 6\' 2"  (1.88 m)   Wt 229 lb (103.9 kg)   BMI 29.40 kg/m   Wt Readings from Last 3 Encounters:  02/24/16 229 lb (103.9 kg)  11/21/15 224 lb 12 oz (101.9 kg)  11/14/15 224 lb 9 oz (101.9 kg)    Physical Exam  Constitutional: He is oriented to person, place, and time.  He appears well-developed and well-nourished. No distress.  HENT:  Head: Normocephalic and atraumatic.  Right Ear: Hearing, tympanic membrane, external ear and ear canal normal.  Left Ear: Hearing, tympanic membrane, external ear and ear canal normal.  Nose: Nose normal.  Mouth/Throat: Uvula is midline, oropharynx is clear and moist and mucous membranes are normal. No oropharyngeal exudate, posterior oropharyngeal edema or posterior oropharyngeal  erythema.  Eyes: Conjunctivae and EOM are normal. Pupils are equal, round, and reactive to light. No scleral icterus.  Neck: Normal range of motion. Neck supple. No thyromegaly present.  Cardiovascular: Normal rate, regular rhythm, normal heart sounds and intact distal pulses.   No murmur heard. Pulses:      Radial pulses are 2+ on the right side, and 2+ on the left side.  Pulmonary/Chest: Effort normal and breath sounds normal. No respiratory distress. He has no wheezes. He has no rales.  Abdominal: Soft. Normal appearance and bowel sounds are normal. He exhibits no distension and no mass. There is no hepatosplenomegaly. There is no tenderness (mild discomfort to epigastric palpation). There is no rebound and no guarding. No hernia.  Musculoskeletal: Normal range of motion. He exhibits no edema.  Lymphadenopathy:    He has no cervical adenopathy.  Neurological: He is alert and oriented to person, place, and time.  CN grossly intact, station and gait intact  Skin: Skin is warm and dry. No rash noted.  Psychiatric: He has a normal mood and affect. His behavior is normal. Judgment and thought content normal.  Nursing note and vitals reviewed.  Results for orders placed or performed in visit on 12/31/14  Lipid panel  Result Value Ref Range   Cholesterol 175 0 - 200 mg/dL   Triglycerides 47.0 0.0 - 149.0 mg/dL   HDL 47.90 >39.00 mg/dL   VLDL 9.4 0.0 - 40.0 mg/dL   LDL Cholesterol 118 (H) 0 - 99 mg/dL   Total CHOL/HDL Ratio 4    NonHDL 99991111   Basic metabolic panel  Result Value Ref Range   Sodium 140 135 - 145 mEq/L   Potassium 4.0 3.5 - 5.1 mEq/L   Chloride 103 96 - 112 mEq/L   CO2 28 19 - 32 mEq/L   Glucose, Bld 95 70 - 99 mg/dL   BUN 18 6 - 23 mg/dL   Creatinine, Ser 1.21 0.40 - 1.50 mg/dL   Calcium 9.3 8.4 - 10.5 mg/dL   GFR 68.30 >60.00 mL/min  TSH  Result Value Ref Range   TSH 1.39 0.35 - 4.50 uIU/mL      Assessment & Plan:   Problem List Items Addressed This Visit      Health maintenance examination - Primary    Preventative protocols reviewed and updated unless pt declined. Discussed healthy diet and lifestyle.       Migraine    Chronic, stable. Rare      Vomiting    Endorses 2 mo h/o am vomiting episodes progressively worsening. No significant GERD sxs or other red flags. Will refer to GI to further eval for HH, gastroparesis, etc. Pt and wife agree with plan.      Relevant Orders   Ambulatory referral to Gastroenterology   CBC with Differential/Platelet   Lipase    Other Visit Diagnoses    Dyslipidemia           Follow up plan: Return in about 1 year (around 02/23/2017), or as needed, for annual exam, prior fasting for blood work.  Ria Bush, MD

## 2016-02-24 NOTE — Assessment & Plan Note (Signed)
Preventative protocols reviewed and updated unless pt declined. Discussed healthy diet and lifestyle.  

## 2016-02-24 NOTE — Progress Notes (Signed)
Pre visit review using our clinic review tool, if applicable. No additional management support is needed unless otherwise documented below in the visit note. 

## 2016-02-27 ENCOUNTER — Encounter: Payer: Self-pay | Admitting: *Deleted

## 2016-05-02 HISTORY — PX: ESOPHAGOGASTRODUODENOSCOPY: SHX1529

## 2016-05-02 HISTORY — PX: COLONOSCOPY: SHX174

## 2016-05-04 ENCOUNTER — Encounter (INDEPENDENT_AMBULATORY_CARE_PROVIDER_SITE_OTHER): Payer: Self-pay

## 2016-05-04 ENCOUNTER — Encounter: Payer: Self-pay | Admitting: Internal Medicine

## 2016-05-04 ENCOUNTER — Ambulatory Visit (INDEPENDENT_AMBULATORY_CARE_PROVIDER_SITE_OTHER): Payer: BLUE CROSS/BLUE SHIELD | Admitting: Internal Medicine

## 2016-05-04 VITALS — BP 116/86 | HR 68 | Ht 72.0 in | Wt 224.4 lb

## 2016-05-04 DIAGNOSIS — R112 Nausea with vomiting, unspecified: Secondary | ICD-10-CM

## 2016-05-04 DIAGNOSIS — R140 Abdominal distension (gaseous): Secondary | ICD-10-CM

## 2016-05-04 DIAGNOSIS — K219 Gastro-esophageal reflux disease without esophagitis: Secondary | ICD-10-CM

## 2016-05-04 DIAGNOSIS — K5909 Other constipation: Secondary | ICD-10-CM

## 2016-05-04 DIAGNOSIS — R1084 Generalized abdominal pain: Secondary | ICD-10-CM

## 2016-05-04 MED ORDER — NA SULFATE-K SULFATE-MG SULF 17.5-3.13-1.6 GM/177ML PO SOLN: 1.0000 | Freq: Once | ORAL | 0 refills | Status: AC

## 2016-05-04 MED ORDER — OMEPRAZOLE 40 MG PO CPDR: 40.0000 mg | DELAYED_RELEASE_CAPSULE | Freq: Every day | ORAL | 11 refills | Status: DC

## 2016-05-04 NOTE — Patient Instructions (Addendum)
We have sent the following medications to your pharmacy for you to pick up at your convenience:  Omeprazole  You have been scheduled for an endoscopy and colonoscopy. Please follow the written instructions given to you at your visit today. Please pick up your prep supplies at the pharmacy within the next 1-3 days. If you use inhalers (even only as needed), please bring them with you on the day of your procedure. Your physician has requested that you go to www.startemmi.com and enter the access code given to you at your visit today. This web site gives a general overview about your procedure. However, you should still follow specific instructions given to you by our office regarding your preparation for the procedure.   

## 2016-05-04 NOTE — Progress Notes (Signed)
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 Danise Moss with a chief complaint of recurrent vomiting, abdominal bloating discomfort, and change in bowel habits. Patient reports being in his usual state of good health until approximate 4 months ago he began to notice that if he ate dinner after 6 PM that upon wakening at his usual time on 5:30 AM he would vomit. Presently undigested food. This occurs several times per week. No problems with vomiting in the morning area he does experience significant postprandial bloating with associated abdominal discomfort. He is also noticed change in his bowel habits. Principally, a bit more the constipated side. No melena or hematochezia. His weight fluctuates but overall has been stable. She does have reflux symptoms manifested by regurgitation and pyrosis. He was placed on ranitidine 150 mg at night approximately 4-6 weeks ago. States this helps slightly. He does take Naprosyn and Flexeril for pain, but only several times per month. He denies dysphagia. Mother has a history of colon polyps. No family history of gastrointestinal cancer. Patient did undergo left inguinal hernia repair proximally 6 years ago for what sounds like impending incarcerated hernia. Review of outside blood work from August 2017 reveals normal comprehensive metabolic panel. No recent CBC.  REVIEW OF SYSTEMS:  All non-GI ROS negative except for Sinus allergy, back pain, cough, headaches, sleeping problems  Past Medical History:  Diagnosis Date  . GERD (gastroesophageal reflux disease)   . Migraine     Past Surgical History:  Procedure Laterality Date  . INGUINAL HERNIA REPAIR Left 2011   David Moss  . MENISCUS REPAIR Right 04/2015    Social History David Moss  reports that he quit smoking about 12 years ago. He quit smokeless tobacco use about 12 years ago. His smokeless tobacco use  included Chew. He reports that he drinks alcohol. He reports that he does not use drugs.  family history includes Asthma in his mother; Colon polyps in his mother; Migraines in his mother; Stroke in his mother.  No Known Allergies     PHYSICAL EXAMINATION: Vital signs: BP 116/86 (BP Location: Left Arm, Patient Position: Sitting, Cuff Size: Normal)   Pulse 68   Ht 6' (1.829 m) Comment: height measured without shoes  Wt 224 lb 6 oz (101.8 kg)   BMI 30.43 kg/m   Constitutional: generally well-appearing, no acute distress Psychiatric: alert and oriented x3, cooperative Eyes: extraocular movements intact, anicteric, conjunctiva pink Mouth: oral pharynx moist, no lesions. Poor dentition. No thrush Neck: supple without thyromegaly Lymph: no lymphadenopathy Cardiovascular: heart regular rate and rhythm, no murmur Lungs: clear to auscultation bilaterally Abdomen: soft, obese, nontender, nondistended, no obvious ascites, no peritoneal signs, normal bowel sounds, no organomegaly. No succussion splash. Well-healed large transverse incision along the lower abdomen below the umbilicus Rectal: Deferred until colonoscopy Extremities: no clubbing cyanosis or lower extremity edema bilaterally Skin: no lesions on visible extremities Neuro: No focal deficits. Cranial nerves intact  ASSESSMENT:  #1. Problems with recurrent nausea and vomiting, bloating discomfort, and change in bowel habits as described. 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 Partially mechanical obstructive processes should be ruled out. #2. GERD. May be secondary to recent problems #3. Constipation  PLAN:  #1. Prescribe omeprazole 40 mg daily #2. Colonoscopy and upper endoscopy to evaluate symptom complex and rule out mechanical processes such as stricture, ulcer, or tumor.The nature of the procedure, as  well as the risks, benefits, and alternatives were carefully and thoroughly reviewed  with the patient. Ample time for discussion and questions allowed. The patient understood, was satisfied, and agreed to proceed. #3. If the above negative then motility workup   A copy of this consultation note has been sent to Dr. Danise Moss

## 2016-05-09 ENCOUNTER — Encounter: Payer: Self-pay | Admitting: Internal Medicine

## 2016-05-16 ENCOUNTER — Ambulatory Visit (AMBULATORY_SURGERY_CENTER): Payer: BLUE CROSS/BLUE SHIELD | Admitting: Internal Medicine

## 2016-05-16 ENCOUNTER — Encounter: Payer: Self-pay | Admitting: Internal Medicine

## 2016-05-16 VITALS — BP 110/76 | HR 63 | Temp 98.0°F | Resp 10 | Ht 72.0 in | Wt 224.0 lb

## 2016-05-16 DIAGNOSIS — D125 Benign neoplasm of sigmoid colon: Secondary | ICD-10-CM

## 2016-05-16 DIAGNOSIS — R112 Nausea with vomiting, unspecified: Secondary | ICD-10-CM

## 2016-05-16 DIAGNOSIS — K5730 Diverticulosis of large intestine without perforation or abscess without bleeding: Secondary | ICD-10-CM | POA: Diagnosis not present

## 2016-05-16 DIAGNOSIS — K2100 Gastro-esophageal reflux disease with esophagitis, without bleeding: Secondary | ICD-10-CM

## 2016-05-16 DIAGNOSIS — K635 Polyp of colon: Secondary | ICD-10-CM

## 2016-05-16 DIAGNOSIS — R1084 Generalized abdominal pain: Secondary | ICD-10-CM

## 2016-05-16 DIAGNOSIS — D123 Benign neoplasm of transverse colon: Secondary | ICD-10-CM

## 2016-05-16 MED ORDER — SODIUM CHLORIDE 0.9 % IV SOLN: 500.0000 mL | INTRAVENOUS | Status: DC

## 2016-05-16 NOTE — Op Note (Signed)
[facility] Patient Name: David Moss Procedure Date: 05/16/2016 10:14 AM MRN: [id number] Endoscopist: Docia Chuck. Henrene Pastor , MD Age: 50 Referring MD:  Date of Birth: 12/10/1965 Gender: Male Account #: [id number] Procedure:                Colonoscopy, with cold snare polypectomy X2 Indications:              Generalized abdominal pain, Incidental change in                            bowel habits noted, Incidental constipation noted Medicines:                Monitored Anesthesia Care Procedure:                Pre-Anesthesia Assessment:                           - Prior to the procedure, a History and Physical                            was performed, and patient medications and                            allergies were reviewed. The patient's tolerance of                            previous anesthesia was also reviewed. The risks                            and benefits of the procedure and the sedation                            options and risks were discussed with the patient.                            All questions were answered, and informed consent                            was obtained. Prior Anticoagulants: The patient has                            taken no previous anticoagulant or antiplatelet                            agents. ASA Grade Assessment: I - A normal, healthy                            patient. After reviewing the risks and benefits,                            the patient was deemed in satisfactory condition to                            undergo the procedure.  After obtaining informed consent, the colonoscope                            was passed under direct vision. Throughout the                            procedure, the patient's blood pressure, pulse, and                            oxygen saturations were monitored continuously. The                            Model CF-HQ190L [id number]) scope was introduced          through the anus and advanced to the the cecum,                            identified by appendiceal orifice and ileocecal                            valve. The terminal ileum, ileocecal valve,                            appendiceal orifice, and rectum were photographed.                            The quality of the bowel preparation was excellent.                            The colonoscopy was performed without difficulty.                            The patient tolerated the procedure well. The bowel                            preparation used was SUPREP. Scope In: 10:25:34 AM Scope Out: 10:39:49 AM Scope Withdrawal Time: 0 hours 12 minutes 27 seconds  Total Procedure Duration: 0 hours 14 minutes 15 seconds  Findings:                 The terminal ileum appeared normal.                           Two polyps were found in the sigmoid colon and                            transverse colon. The polyps were 2 to 4 mm in                            size. These polyps were removed with a cold snare.                            Resection and retrieval were complete.  Multiple diverticula were found in the sigmoid                            colon.                           Internal hemorrhoids were found during retroflexion.                           The exam was otherwise without abnormality on                            direct and retroflexion views. Complications:            No immediate complications. Estimated blood loss:                            None. Estimated Blood Loss:     Estimated blood loss: none. Impression:               - The examined portion of the ileum was normal.                           - Two 2 to 4 mm polyps in the sigmoid colon and in                            the transverse colon, removed with a cold snare.                            Resected and retrieved.                           - Diverticulosis in the sigmoid colon.                            - Internal hemorrhoids.                           - The examination was otherwise normal on direct                            and retroflexion views. Recommendation:           - Repeat colonoscopy in 5-10 years for                            surveillance, pending the results of pathology.                           - EGD today. Please see report for final                            impressions and recommendations.                           - Await pathology results. Docia Chuck. Henrene Pastor, MD 05/16/2016 10:53:24 AM This report has been signed electronically.

## 2016-05-16 NOTE — Op Note (Signed)
[facility] Patient Name: David Moss Procedure Date: 05/16/2016 10:14 AM MRN: [id number] Endoscopist: Docia Chuck. Henrene Pastor , MD Age: 50 Referring MD:  Date of Birth: 03-Oct-1965 Gender: Male Account #: [id number] Procedure:                Upper GI endoscopy Indications:              Epigastric abdominal pain, Nausea with vomiting Medicines:                Monitored Anesthesia Care Procedure:                Pre-Anesthesia Assessment:                           - Prior to the procedure, a History and Physical                            was performed, and patient medications and                            allergies were reviewed. The patient's tolerance of                            previous anesthesia was also reviewed. The risks                            and benefits of the procedure and the sedation                            options and risks were discussed with the patient.                            All questions were answered, and informed consent                            was obtained. Prior Anticoagulants: The patient has                            taken no previous anticoagulant or antiplatelet                            agents. ASA Grade Assessment: I - A normal, healthy                            patient. After reviewing the risks and benefits,                            the patient was deemed in satisfactory condition to                            undergo the procedure.                           After obtaining informed consent, the endoscope was  passed under direct vision. Throughout the                            procedure, the patient's blood pressure, pulse, and                            oxygen saturations were monitored continuously. The                            Model GIF-HQ190 [id number]) scope was introduced                            through the mouth, and advanced to the second part                            of duodenum. The  upper GI endoscopy was                            accomplished without difficulty. The patient                            tolerated the procedure well. Scope In: Scope Out: Findings:                 The esophagus revealed mild distal esophagitis as                            manifested by healing linear erosions at the GE                            junction. The esophagus was otherwise normal.                           The stomach was normal.                           The examined duodenum was normal.                           The cardia and gastric fundus were normal on                            retroflexion. Complications:            No immediate complications. Estimated Blood Loss:     Estimated blood loss: none. Impression:               1. GERD with mild distal esophagitis                           2. Otherwise normal EGD Recommendation:           1. Please continue prescribed omeprazole daily                           2. Schedule patient for contrast-enhanced CT scan  of the abdomen and pelvis. "Recurrent abdominal                            pain with nausea and vomiting". We will contact you                            with the results                           3. Office follow-up with Dr. Henrene Pastor in about 4-6                            weeks Docia Chuck. Henrene Pastor, MD 05/16/2016 10:58:57 AM This report has been signed electronically.

## 2016-05-16 NOTE — Progress Notes (Signed)
A and O x3. Report to RN. Tolerated MAC anesthesia well.Teeth unchanged after procedure. 

## 2016-05-16 NOTE — Patient Instructions (Addendum)
Impression/Recommendations:  GERD handout given to patient. Esophagitis handout given to patient. Polyp handout, Diverticulosis handout, and Hemorrhoid handouts given to patient.  Continue omeprazole daily as prescribed.  Follow up with Dr. Henrene Pastor in 4-6 weeks.  Schedule for CT scan of abdomen and pelvis.  Repeat colonoscopy in 5-10 years for surveillance based on pathology results.  YOU HAD AN ENDOSCOPIC PROCEDURE TODAY AT [facility]:   Refer to the procedure report that was given to you for any specific questions about what was found during the examination.  If the procedure report does not answer your questions, please call your gastroenterologist to clarify.  If you requested that your care partner not be given the details of your procedure findings, then the procedure report has been included in a sealed envelope for you to review at your convenience later.  YOU SHOULD EXPECT: Some feelings of bloating in the abdomen. Passage of more gas than usual.  Walking can help get rid of the air that was put into your GI tract during the procedure and reduce the bloating. If you had a lower endoscopy (such as a colonoscopy or flexible sigmoidoscopy) you may notice spotting of blood in your stool or on the toilet paper. If you underwent a bowel prep for your procedure, you may not have a normal bowel movement for a few days.  Please Note:  You might notice some irritation and congestion in your nose or some drainage.  This is from the oxygen used during your procedure.  There is no need for concern and it should clear up in a day or so.  SYMPTOMS TO REPORT IMMEDIATELY:   Following lower endoscopy (colonoscopy or flexible sigmoidoscopy):  Excessive amounts of blood in the stool  Significant tenderness or worsening of abdominal pains  Swelling of the abdomen that is new, acute  Fever of 100F or higher   Following upper endoscopy (EGD)  Vomiting of blood or coffee ground  material  New chest pain or pain under the shoulder blades  Painful or persistently difficult swallowing  New shortness of breath  Fever of 100F or higher  Black, tarry-looking stools  For urgent or emergent issues, a gastroenterologist can be reached at any hour by calling [phone].   DIET:  We do recommend a small meal at first, but then you may proceed to your regular diet.  Drink plenty of fluids but you should avoid alcoholic beverages for 24 hours.  ACTIVITY:  You should plan to take it easy for the rest of today and you should NOT DRIVE or use heavy machinery until tomorrow (because of the sedation medicines used during the test).    FOLLOW UP: Our staff will call the number listed on your records the next business day following your procedure to check on you and address any questions or concerns that you may have regarding the information given to you following your procedure. If we do not reach you, we will leave a message.  However, if you are feeling well and you are not experiencing any problems, there is no need to return our call.  We will assume that you have returned to your regular daily activities without incident.  If any biopsies were taken you will be contacted by phone or by letter within the next 1-3 weeks.  Please call us at [phone] if you have not heard about the biopsies in 3 weeks.    SIGNATURES/CONFIDENTIALITY: You and/or your care partner have signed paperwork which  will be entered into your electronic medical record.  These signatures attest to the fact that that the information above on your After Visit Summary has been reviewed and is understood.  Full responsibility of the confidentiality of this discharge information lies with you and/or your care-partner.

## 2016-05-16 NOTE — Progress Notes (Signed)
Called to room to assist during endoscopic procedure.  Patient ID and intended procedure confirmed with present staff. Received instructions for my participation in the procedure from the performing physician.  

## 2016-05-17 ENCOUNTER — Telehealth: Payer: Self-pay | Admitting: *Deleted

## 2016-05-17 ENCOUNTER — Telehealth: Payer: Self-pay

## 2016-05-17 ENCOUNTER — Other Ambulatory Visit: Payer: Self-pay

## 2016-05-17 DIAGNOSIS — R112 Nausea with vomiting, unspecified: Secondary | ICD-10-CM

## 2016-05-17 DIAGNOSIS — R1084 Generalized abdominal pain: Secondary | ICD-10-CM

## 2016-05-17 NOTE — Telephone Encounter (Signed)
Pt scheduled for CT of A/P at [facility] 05/29/16@1 :30pm. Pt to be NPO after 9:30am, drink bottle one of contrast at 11:30am, bottle 2 at 12:30pm.   Pt scheduled to see Dr. Henrene Pastor 07/10/16@9 :15am. Left message for pt to call back.

## 2016-05-17 NOTE — Telephone Encounter (Signed)
  Follow up Call-  Call back number 05/16/2016  Post procedure Call Back phone  # [phone]  Permission to leave phone message Yes  Some recent data might be hidden     Patient questions:  Do you have a fever, pain , or abdominal swelling? No. Pain Score  0 *  Have you tolerated food without any problems? Yes.    Have you been able to return to your normal activities? Yes.    Do you have any questions about your discharge instructions: Diet   No. Medications  No. Follow up visit  No.  Do you have questions or concerns about your Care? No.  Actions: * If pain score is 4 or above: No action needed, pain <4.

## 2016-05-21 ENCOUNTER — Encounter: Payer: Self-pay | Admitting: Internal Medicine

## 2016-05-21 NOTE — Telephone Encounter (Signed)
Pt aware of appt and will pick up contrast from our office. Contrast left up front for pickup.

## 2016-05-26 ENCOUNTER — Encounter: Payer: Self-pay | Admitting: Family Medicine

## 2016-05-29 ENCOUNTER — Ambulatory Visit (INDEPENDENT_AMBULATORY_CARE_PROVIDER_SITE_OTHER)
Admission: RE | Admit: 2016-05-29 | Discharge: 2016-05-29 | Disposition: A | Discharge status: Home / Self Care | Payer: BLUE CROSS/BLUE SHIELD | Source: Ambulatory Visit | Attending: Internal Medicine | Admitting: Internal Medicine

## 2016-05-29 DIAGNOSIS — R112 Nausea with vomiting, unspecified: Secondary | ICD-10-CM

## 2016-05-29 DIAGNOSIS — R1084 Generalized abdominal pain: Secondary | ICD-10-CM | POA: Diagnosis not present

## 2016-05-29 MED ORDER — IOPAMIDOL (ISOVUE-300) INJECTION 61%
100.0000 mL | Freq: Once | INTRAVENOUS | Status: AC | PRN
  Administered 2016-05-29: 100 mL via INTRAVENOUS

## 2016-07-10 ENCOUNTER — Ambulatory Visit: Payer: BLUE CROSS/BLUE SHIELD | Admitting: Internal Medicine

## 2016-11-19 ENCOUNTER — Ambulatory Visit (INDEPENDENT_AMBULATORY_CARE_PROVIDER_SITE_OTHER): Payer: BLUE CROSS/BLUE SHIELD | Admitting: Family Medicine

## 2016-11-19 ENCOUNTER — Encounter: Payer: Self-pay | Admitting: Family Medicine

## 2016-11-19 DIAGNOSIS — J069 Acute upper respiratory infection, unspecified: Secondary | ICD-10-CM

## 2016-11-19 DIAGNOSIS — B9789 Other viral agents as the cause of diseases classified elsewhere: Secondary | ICD-10-CM | POA: Diagnosis not present

## 2016-11-19 MED ORDER — GUAIFENESIN-CODEINE 100-10 MG/5ML PO SYRP: 5.0000 mL | ORAL_SOLUTION | Freq: Every evening | ORAL | 0 refills | Status: DC | PRN

## 2016-11-19 NOTE — Progress Notes (Signed)
   Subjective:    Patient ID: David Moss, male    DOB: January 05, 1966, 51 y.o.   MRN: [id number]  Cough  This is a new problem. The current episode started yesterday. The problem has been gradually worsening. The cough is productive of sputum. Associated symptoms include ear congestion, nasal congestion, postnasal drip and a sore throat. Pertinent negatives include no chest pain, chills, ear pain, fever, headaches, myalgias, shortness of breath or wheezing. Associated symptoms comments: Sinus pressure in nose. The symptoms are aggravated by lying down (keping him up at night). Risk factors: nonsmoker. Treatments tried:  claritin. His past medical history is significant for environmental allergies. There is no history of asthma, bronchitis, COPD or pneumonia.      Review of Systems  Constitutional: Negative for chills and fever.  HENT: Positive for postnasal drip and sore throat. Negative for ear pain.   Respiratory: Positive for cough. Negative for shortness of breath and wheezing.   Cardiovascular: Negative for chest pain.  Musculoskeletal: Negative for myalgias.  Allergic/Immunologic: Positive for environmental allergies.  Neurological: Negative for headaches.   Blood pressure 110/70, pulse 90, temperature 98.8 F (37.1 C), temperature source Oral, height 6\' 2"  (1.88 m), weight 232 lb 8 oz (105.5 kg).      Objective:   Physical Exam  Constitutional: Vital signs are normal. He appears well-developed and well-nourished.  Non-toxic appearance. He does not appear ill. No distress.  HENT:  Head: Normocephalic and atraumatic.  Right Ear: Hearing, tympanic membrane, external ear and ear canal normal. No tenderness. No foreign bodies. Tympanic membrane is not retracted and not bulging.  Left Ear: Hearing, tympanic membrane, external ear and ear canal normal. No tenderness. No foreign bodies. Tympanic membrane is not retracted and not bulging.  Nose: Nose normal. No mucosal edema or  rhinorrhea. Right sinus exhibits no maxillary sinus tenderness and no frontal sinus tenderness. Left sinus exhibits no maxillary sinus tenderness and no frontal sinus tenderness.  Mouth/Throat: Uvula is midline, oropharynx is clear and moist and mucous membranes are normal. Normal dentition. No dental caries. No oropharyngeal exudate or tonsillar abscesses.  Eyes: Conjunctivae, EOM and lids are normal. Pupils are equal, round, and reactive to light. Lids are everted and swept, no foreign bodies found.  Neck: Trachea normal, normal range of motion and phonation normal. Neck supple. Carotid bruit is not present. No thyroid mass and no thyromegaly present.  Cardiovascular: Normal rate, regular rhythm, S1 normal, S2 normal, normal heart sounds, intact distal pulses and normal pulses.  Exam reveals no gallop.   No murmur heard. Pulmonary/Chest: Effort normal and breath sounds normal. No respiratory distress. He has no wheezes. He has no rhonchi. He has no rales.  Abdominal: Soft. Normal appearance and bowel sounds are normal. There is no hepatosplenomegaly. There is no tenderness. There is no rebound, no guarding and no CVA tenderness. No hernia.  Neurological: He is alert. He has normal reflexes.  Skin: Skin is warm, dry and intact. No rash noted.  Psychiatric: He has a normal mood and affect. His speech is normal and behavior is normal. Judgment normal.          Assessment & Plan:

## 2016-11-19 NOTE — Assessment & Plan Note (Signed)
Rest fluids, symptomatic care.  Cough suppressant at night prn.

## 2016-11-19 NOTE — Patient Instructions (Signed)
Rest, fluids. Cough suppressant at night.  Mucinex DM during the day.  Call if not improving in 7-10 days, or new fever late in illness or shortness of breath.

## 2017-02-15 ENCOUNTER — Encounter (INDEPENDENT_AMBULATORY_CARE_PROVIDER_SITE_OTHER): Payer: Self-pay

## 2017-02-15 ENCOUNTER — Ambulatory Visit
Admission: RE | Admit: 2017-02-15 | Discharge: 2017-02-15 | Disposition: A | Discharge status: Home / Self Care | Payer: BLUE CROSS/BLUE SHIELD | Source: Ambulatory Visit | Attending: Family Medicine | Admitting: Family Medicine

## 2017-02-15 ENCOUNTER — Encounter: Payer: Self-pay | Admitting: Family Medicine

## 2017-02-15 ENCOUNTER — Ambulatory Visit (INDEPENDENT_AMBULATORY_CARE_PROVIDER_SITE_OTHER): Payer: BLUE CROSS/BLUE SHIELD | Admitting: Family Medicine

## 2017-02-15 VITALS — BP 132/72 | HR 63 | Temp 97.4°F | Wt 232.5 lb

## 2017-02-15 DIAGNOSIS — R221 Localized swelling, mass and lump, neck: Secondary | ICD-10-CM

## 2017-02-15 NOTE — Addendum Note (Signed)
Addended by: Ria Bush on: 02/15/2017 08:26 AM   Modules accepted: Orders

## 2017-02-15 NOTE — Progress Notes (Addendum)
BP 132/72   Pulse 63   Temp (!) 97.4 F (36.3 C) (Oral)   Wt 232 lb 8 oz (105.5 kg)   SpO2 97%   BMI 29.85 kg/m    CC: check throat Subjective:    Patient ID: David Moss, male    DOB: 03/15/1966, 51 y.o.   MRN: [id number]  HPI: David Moss is a 51 y.o. male presenting on 02/15/2017 for Cyst (neck x2wks)   2 wk h/o lump in throat started small and mobile and now enlarging and getting former. Not tender. No fevers/chills. Throat feels sore. Globus sensation and feels he heeds to clear his throat. No unexpected weight loss.   No fmhx thyroid disease or cancer.   Ex smoker - and ex- smokeless tobacco - quit both 2005.  Rare alcohol (a few times a month). No recent dental evaluation.   Known GERD with esophagitis by EGD last year treated with omeprazole 40mg  daily and zantac at bedtime.   Relevant past medical, surgical, family and social history reviewed and updated as indicated. Interim medical history since our last visit reviewed. Allergies and medications reviewed and updated. Outpatient Medications Prior to Visit  Medication Sig Dispense Refill  . cyclobenzaprine (FLEXERIL) 10 MG tablet Take 1 tablet (10 mg total) by mouth 2 (two) times daily as needed for muscle spasms (sedation precautions). 30 tablet 0  . naproxen (NAPROSYN) 500 MG tablet Take one po bid x 1 week then prn pain, take with food 30 tablet 0  . omeprazole (PRILOSEC) 40 MG capsule Take 1 capsule (40 mg total) by mouth daily. 30 capsule 11  . ranitidine (ZANTAC) 150 MG tablet Take 1 tablet (150 mg total) by mouth at bedtime. 30 tablet 11  . SUMAtriptan (IMITREX) 100 MG tablet Take 1 tablet (100 mg total) by mouth once. May repeat in 2 hours if headache persists or recurs. 10 tablet 3  . guaiFENesin-codeine (ROBITUSSIN AC) 100-10 MG/5ML syrup Take 5-10 mLs by mouth at bedtime as needed for cough. 180 mL 0   Facility-Administered Medications Prior to Visit  Medication Dose Route Frequency Provider  Last Rate Last Dose  . 0.9 %  sodium chloride infusion  500 mL Intravenous Continuous Irene Shipper, MD         Per HPI unless specifically indicated in ROS section below Review of Systems     Objective:    BP 132/72   Pulse 63   Temp (!) 97.4 F (36.3 C) (Oral)   Wt 232 lb 8 oz (105.5 kg)   SpO2 97%   BMI 29.85 kg/m   Wt Readings from Last 3 Encounters:  02/15/17 232 lb 8 oz (105.5 kg)  11/19/16 232 lb 8 oz (105.5 kg)  05/16/16 224 lb (101.6 kg)    Physical Exam  Constitutional: He appears well-developed and well-nourished. No distress.  HENT:  Mouth/Throat: Oropharynx is clear and moist. No oropharyngeal exudate.  Poor dentition  Neck: Normal range of motion. Neck supple. No thyromegaly present.  Firm nontender mass L neck just above thyroid tissue ~3cm diameter No surrounding LAD appreciated  Lymphadenopathy:       Head (right side): No submental, no submandibular, no tonsillar, no preauricular and no posterior auricular adenopathy present.       Head (left side): No submental, no submandibular, no tonsillar, no preauricular and no posterior auricular adenopathy present.    He has no cervical adenopathy.       Right: No supraclavicular adenopathy present.  Left: No supraclavicular adenopathy present.  Nursing note and vitals reviewed.     Assessment & Plan:   Problem List Items Addressed This Visit    Mass of left side of neck - Primary    New, 2-3 wks duration. Concerning firm feel to neck mass. Discussed ddx includes thyroid mass r/o cancer, thyroglossal duct cyst, other skin cyst, LAD, other. Will start with neck US and likely refer to ENT for definitive management. Pt agrees with plan.  Noted poor dentition with gingival disease.      Relevant Orders   US Soft Tissue Head/Neck   Ambulatory referral to ENT       Follow up plan: Return if symptoms worsen or fail to improve.  Ria Bush, MD

## 2017-02-15 NOTE — Assessment & Plan Note (Signed)
New, 2-3 wks duration. Concerning firm feel to neck mass. Discussed ddx includes thyroid mass r/o cancer, thyroglossal duct cyst, other skin cyst, LAD, other. Will start with neck US and likely refer to ENT for definitive management. Pt agrees with plan.  Noted poor dentition with gingival disease.

## 2017-02-15 NOTE — Patient Instructions (Signed)
See our referral coordinator to schedule throat ultrasound and then we will refer you to ENT.

## 2017-02-20 ENCOUNTER — Encounter: Payer: Self-pay | Admitting: *Deleted

## 2017-02-25 NOTE — Discharge Instructions (Signed)
General Anesthesia, Adult, Care After °These instructions provide you with information about caring for yourself after your procedure. Your health care provider may also give you more specific instructions. Your treatment has been planned according to current medical practices, but problems sometimes occur. Call your health care provider if you have any problems or questions after your procedure. °What can I expect after the procedure? °After the procedure, it is common to have: °· Vomiting. °· A sore throat. °· Mental slowness. ° °It is common to feel: °· Nauseous. °· Cold or shivery. °· Sleepy. °· Tired. °· Sore or achy, even in parts of your body where you did not have surgery. ° °Follow these instructions at home: °For at least 24 hours after the procedure: °· Do not: °? Participate in activities where you could fall or become injured. °? Drive. °? Use heavy machinery. °? Drink alcohol. °? Take sleeping pills or medicines that cause drowsiness. °? Make important decisions or sign legal documents. °? Take care of children on your own. °· Rest. °Eating and drinking °· If you vomit, drink water, juice, or soup when you can drink without vomiting. °· Drink enough fluid to keep your urine clear or pale yellow. °· Make sure you have little or no nausea before eating solid foods. °· Follow the diet recommended by your health care provider. °General instructions °· Have a responsible adult stay with you until you are awake and alert. °· Return to your normal activities as told by your health care provider. Ask your health care provider what activities are safe for you. °· Take over-the-counter and prescription medicines only as told by your health care provider. °· If you smoke, do not smoke without supervision. °· Keep all follow-up visits as told by your health care provider. This is important. °Contact a health care provider if: °· You continue to have nausea or vomiting at home, and medicines are not helpful. °· You  cannot drink fluids or start eating again. °· You cannot urinate after 8-12 hours. °· You develop a skin rash. °· You have fever. °· You have increasing redness at the site of your procedure. °Get help right away if: °· You have difficulty breathing. °· You have chest pain. °· You have unexpected bleeding. °· You feel that you are having a life-threatening or urgent problem. °This information is not intended to replace advice given to you by your health care provider. Make sure you discuss any questions you have with your health care provider. °Document Released: 09/24/2000 Document Revised: 11/21/2015 Document Reviewed: 06/02/2015 °Elsevier Interactive Patient Education © 2018 [employer] ° °

## 2017-02-26 ENCOUNTER — Ambulatory Visit: Payer: BLUE CROSS/BLUE SHIELD | Admitting: Anesthesiology

## 2017-02-26 ENCOUNTER — Encounter
Admission: RE | Disposition: A | Discharge status: Home / Self Care | Payer: Self-pay | Source: Ambulatory Visit | Attending: Otolaryngology

## 2017-02-26 ENCOUNTER — Ambulatory Visit
Admission: RE | Admit: 2017-02-26 | Discharge: 2017-02-26 | Disposition: A | Discharge status: Home / Self Care | Payer: BLUE CROSS/BLUE SHIELD | Source: Ambulatory Visit | Attending: Otolaryngology | Admitting: Otolaryngology

## 2017-02-26 DIAGNOSIS — M729 Fibroblastic disorder, unspecified: Secondary | ICD-10-CM | POA: Diagnosis not present

## 2017-02-26 DIAGNOSIS — K219 Gastro-esophageal reflux disease without esophagitis: Secondary | ICD-10-CM | POA: Diagnosis not present

## 2017-02-26 DIAGNOSIS — Z79899 Other long term (current) drug therapy: Secondary | ICD-10-CM | POA: Diagnosis not present

## 2017-02-26 DIAGNOSIS — R221 Localized swelling, mass and lump, neck: Secondary | ICD-10-CM | POA: Diagnosis present

## 2017-02-26 DIAGNOSIS — Z87891 Personal history of nicotine dependence: Secondary | ICD-10-CM | POA: Diagnosis not present

## 2017-02-26 DIAGNOSIS — Z791 Long term (current) use of non-steroidal anti-inflammatories (NSAID): Secondary | ICD-10-CM | POA: Insufficient documentation

## 2017-02-26 HISTORY — PX: EXCISION MASS NECK: SHX6703

## 2017-02-26 SURGERY — EXCISION, MASS, NECK
Anesthesia: General | Site: Neck | Laterality: Left | Wound class: Clean

## 2017-02-26 MED ORDER — EPHEDRINE SULFATE 50 MG/ML IJ SOLN
INTRAMUSCULAR | Status: DC | PRN
  Administered 2017-02-26 (×5): 5 mg via INTRAVENOUS

## 2017-02-26 MED ORDER — OXYCODONE HCL 5 MG/5ML PO SOLN: 5.0000 mg | Freq: Once | ORAL | Status: DC | PRN

## 2017-02-26 MED ORDER — DEXAMETHASONE SODIUM PHOSPHATE 4 MG/ML IJ SOLN
INTRAMUSCULAR | Status: DC | PRN
  Administered 2017-02-26: 10 mg via INTRAVENOUS

## 2017-02-26 MED ORDER — ONDANSETRON HCL 4 MG/2ML IJ SOLN
INTRAMUSCULAR | Status: DC | PRN
  Administered 2017-02-26: 4 mg via INTRAVENOUS

## 2017-02-26 MED ORDER — LIDOCAINE HCL (CARDIAC) 20 MG/ML IV SOLN
INTRAVENOUS | Status: DC | PRN
  Administered 2017-02-26: 50 mg via INTRATRACHEAL

## 2017-02-26 MED ORDER — FENTANYL CITRATE (PF) 100 MCG/2ML IJ SOLN
INTRAMUSCULAR | Status: DC | PRN
  Administered 2017-02-26: 50 ug via INTRAVENOUS

## 2017-02-26 MED ORDER — GLYCOPYRROLATE 0.2 MG/ML IJ SOLN
INTRAMUSCULAR | Status: DC | PRN
  Administered 2017-02-26: 0.1 mg via INTRAVENOUS

## 2017-02-26 MED ORDER — OXYCODONE HCL 5 MG PO TABS
5.0000 mg | ORAL_TABLET | Freq: Once | ORAL | Status: DC | PRN
Start: 2017-02-26 — End: 2017-02-26

## 2017-02-26 MED ORDER — ACETAMINOPHEN 160 MG/5ML PO SOLN: 325.0000 mg | ORAL | Status: DC | PRN

## 2017-02-26 MED ORDER — ACETAMINOPHEN 325 MG PO TABS: 325.0000 mg | ORAL_TABLET | ORAL | Status: DC | PRN

## 2017-02-26 MED ORDER — FENTANYL CITRATE (PF) 100 MCG/2ML IJ SOLN
25.0000 ug | INTRAMUSCULAR | Status: DC | PRN
Start: 2017-02-26 — End: 2017-02-26

## 2017-02-26 MED ORDER — HYDROCODONE-ACETAMINOPHEN 5-325 MG PO TABS: 1.0000 | ORAL_TABLET | Freq: Four times a day (QID) | ORAL | 0 refills | Status: DC | PRN

## 2017-02-26 MED ORDER — MIDAZOLAM HCL 5 MG/5ML IJ SOLN
INTRAMUSCULAR | Status: DC | PRN
  Administered 2017-02-26: 2 mg via INTRAVENOUS

## 2017-02-26 MED ORDER — LACTATED RINGERS IV SOLN: 10.0000 mL/h | INTRAVENOUS | Status: DC

## 2017-02-26 MED ORDER — LIDOCAINE-EPINEPHRINE 1 %-1:100000 IJ SOLN
INTRAMUSCULAR | Status: DC | PRN
  Administered 2017-02-26: 3 mL

## 2017-02-26 MED ORDER — LACTATED RINGERS IV SOLN
INTRAVENOUS | Status: DC
Start: 2017-02-26 — End: 2017-02-26
  Administered 2017-02-26: 07:00:00 via INTRAVENOUS

## 2017-02-26 MED ORDER — PROPOFOL 10 MG/ML IV BOLUS
INTRAVENOUS | Status: DC | PRN
  Administered 2017-02-26: 150 mg via INTRAVENOUS

## 2017-02-26 MED ORDER — ONDANSETRON HCL 4 MG/2ML IJ SOLN: 4.0000 mg | Freq: Once | INTRAMUSCULAR | Status: DC | PRN

## 2017-02-26 SURGICAL SUPPLY — 28 items
CANISTER SUCT 1200ML W/VALVE (MISCELLANEOUS) IMPLANT
CORD BIP STRL DISP 12FT (MISCELLANEOUS) ×3 IMPLANT
DECANTER SPIKE VIAL GLASS SM (MISCELLANEOUS) ×3 IMPLANT
DERMABOND ADVANCED (GAUZE/BANDAGES/DRESSINGS)
DERMABOND ADVANCED .7 DNX12 (GAUZE/BANDAGES/DRESSINGS) IMPLANT
DRSG TEGADERM 2-3/8X2-3/4 SM (GAUZE/BANDAGES/DRESSINGS) ×3 IMPLANT
DRSG TELFA 4X3 1S NADH ST (GAUZE/BANDAGES/DRESSINGS) ×3 IMPLANT
GAUZE SPONGE 4X4 12PLY STRL (GAUZE/BANDAGES/DRESSINGS) IMPLANT
GLOVE BIO SURGEON STRL SZ7.5 (GLOVE) ×6 IMPLANT
GOWN STRL REUS W/ TWL LRG LVL3 (GOWN DISPOSABLE) IMPLANT
GOWN STRL REUS W/TWL LRG LVL3 (GOWN DISPOSABLE)
HANDLE YANKAUER SUCT BULB TIP (MISCELLANEOUS) ×3 IMPLANT
KIT ROOM TURNOVER OR (KITS) ×3 IMPLANT
NEEDLE HYPO 25GX1X1/2 BEV (NEEDLE) ×3 IMPLANT
NS IRRIG 500ML POUR BTL (IV SOLUTION) ×3 IMPLANT
PACK HEAD/NECK (MISCELLANEOUS) ×3 IMPLANT
SPONGE KITTNER 5P (MISCELLANEOUS) IMPLANT
SUT PROLENE 5 0 P 3 (SUTURE) ×3 IMPLANT
SUT SILK 2 0 (SUTURE)
SUT SILK 2-0 18XBRD TIE 12 (SUTURE) IMPLANT
SUT SILK 3 0 (SUTURE)
SUT SILK 3-0 18XBRD TIE 12 (SUTURE) IMPLANT
SUT VIC AB 4-0 FS2 27 (SUTURE) IMPLANT
SUT VIC AB 4-0 RB1 18 (SUTURE) ×3 IMPLANT
SUT VIC AB 4-0 RB1 27 (SUTURE)
SUT VIC AB 4-0 RB1 27X BRD (SUTURE) IMPLANT
SYR BULB EAR ULCER 3OZ GRN STR (SYRINGE) ×3 IMPLANT
SYRINGE 10CC LL (SYRINGE) IMPLANT

## 2017-02-26 NOTE — H&P (Signed)
History and physical reviewed and will be scanned in later. No change in medical status reported by the patient or family, appears stable for surgery. All questions regarding the procedure answered, and patient (or family if a child) expressed understanding of the procedure.  Varie Machamer S @TODAY@ 

## 2017-02-26 NOTE — Anesthesia Procedure Notes (Signed)
Procedure Name: LMA Insertion Date/Time: 02/26/2017 7:39 AM Performed by: Mayme Genta Pre-anesthesia Checklist: Patient identified, Emergency Drugs available, Suction available, Timeout performed and Patient being monitored Patient Re-evaluated:Patient Re-evaluated prior to induction Oxygen Delivery Method: Circle system utilized Preoxygenation: Pre-oxygenation with 100% oxygen Induction Type: IV induction LMA: LMA inserted LMA Size: 4.0 Number of attempts: 1 Placement Confirmation: positive ETCO2 and breath sounds checked- equal and bilateral Tube secured with: Tape

## 2017-02-26 NOTE — Anesthesia Preprocedure Evaluation (Signed)
Anesthesia Evaluation  Patient identified by MRN, date of birth, ID band Patient awake    Reviewed: Allergy & Precautions, H&P , NPO status , Patient's Chart, lab work & pertinent test results  Airway Mallampati: II  TM Distance: >3 FB Neck ROM: full    Dental no notable dental hx.    Pulmonary former smoker,    Pulmonary exam normal breath sounds clear to auscultation       Cardiovascular Normal cardiovascular exam Rhythm:regular Rate:Normal     Neuro/Psych    GI/Hepatic GERD  ,  Endo/Other    Renal/GU      Musculoskeletal   Abdominal   Peds  Hematology   Anesthesia Other Findings   Reproductive/Obstetrics                             Anesthesia Physical Anesthesia Plan  ASA: II  Anesthesia Plan: General LMA   Post-op Pain Management:    Induction:   PONV Risk Score and Plan: 2 and Ondansetron, Dexamethasone and Midazolam  Airway Management Planned:   Additional Equipment:   Intra-op Plan:   Post-operative Plan:   Informed Consent: I have reviewed the patients History and Physical, chart, labs and discussed the procedure including the risks, benefits and alternatives for the proposed anesthesia with the patient or authorized representative who has indicated his/her understanding and acceptance.     Plan Discussed with: CRNA  Anesthesia Plan Comments:         Anesthesia Quick Evaluation

## 2017-02-26 NOTE — Anesthesia Postprocedure Evaluation (Signed)
Anesthesia Post Note  Patient: David Moss  Procedure(s) Performed: Procedure(s) (LRB): EXCISION OF SUBCUTANEOUS MASS NECK LEFT (Left)  Patient location during evaluation: PACU Anesthesia Type: General Level of consciousness: awake and alert and oriented Pain management: satisfactory to patient Vital Signs Assessment: post-procedure vital signs reviewed and stable Respiratory status: spontaneous breathing, nonlabored ventilation and respiratory function stable Cardiovascular status: blood pressure returned to baseline and stable Postop Assessment: Adequate PO intake and No signs of nausea or vomiting Anesthetic complications: no    Raliegh Ip

## 2017-02-26 NOTE — Op Note (Signed)
02/26/2017  8:30 AM    Leeroy Bock  [id number]   Pre-Op Diagnosis:  LEFT NECK MASS  Post-op Diagnosis: LEFT NECK MASS  Procedure:   EXCISION OF LEFT DEEP NECK MASS  Surgeon:  Riley Nearing  Anesthesia:  General LMA  EBL:  Less than 10 cc  Complications:  None  Findings: 2 cm mid-neck mass to left of midline (paralaryngeal), subcutaneous with extension through the platysma, poorly defined capsule  Procedure: After the patient was identified in holding and the procedure was reviewed.  The patient was taken to the operating room and with the patient in a comfortable supine position,  general anesthesia was induced without difficulty with LMA.  A proper time-out was performed, confirming the operative site and procedure. The table was turned slightly and skin injected with 1% lidocaine with epinephrine 1:100,000. The neck was then prepped and draped in the usual sterile fashion.   The skin was incised with a 15 blade over the mass and dissection proceeded into the subcutaneous fat. The mass had a poorly defined capsule, possibly due to inflammation, but was not attached to the dermis. A cuff of fat was taken around the mass. Dissection proceeded circumferentially around the mass. On its deeper aspect the mass involved the platysma and was adherent to the muscle. The platysma was divided its involved portion removed along with the mass, but there was a good plane immediately deep to the platysma. The mass was delivered and sent for pathology. The bipolar was used to control minor bleeding. The wound was then irrigated with saline. The platysma was closed with 4-0 Vicryl, followed by the subcutaneous layer. The skin was closed with 5-0 Prolene. Bacitracin was applied, followed by a Telfa and Tegaderm dressing.   The patient was then returned to the anesthesiologist in good condition for awakening. The patient was awakened and taken to the recovery room in good  condition.   Disposition:   PACU then D/C home.   Plan: F/u in 1 week for suture removal.  Riley Nearing 02/26/2017 8:30 AM

## 2017-02-26 NOTE — Transfer of Care (Signed)
Immediate Anesthesia Transfer of Care Note  Patient: David Moss  Procedure(s) Performed: Procedure(s): EXCISION OF SUBCUTANEOUS MASS NECK LEFT (Left)  Patient Location: PACU  Anesthesia Type: General LMA  Level of Consciousness: awake, alert  and patient cooperative  Airway and Oxygen Therapy: Patient Spontanous Breathing and Patient connected to supplemental oxygen  Post-op Assessment: Post-op Vital signs reviewed, Patient's Cardiovascular Status Stable, Respiratory Function Stable, Patent Airway and No signs of Nausea or vomiting  Post-op Vital Signs: Reviewed and stable  Complications: No apparent anesthesia complications

## 2017-02-27 ENCOUNTER — Encounter: Payer: Self-pay | Admitting: Otolaryngology

## 2017-03-11 ENCOUNTER — Other Ambulatory Visit: Payer: Self-pay | Admitting: Pathology

## 2017-03-18 LAB — SURGICAL PATHOLOGY

## 2017-03-19 ENCOUNTER — Inpatient Hospital Stay: Payer: BLUE CROSS/BLUE SHIELD | Attending: Internal Medicine | Admitting: Internal Medicine

## 2017-03-19 NOTE — Progress Notes (Deleted)
[facility] NOTE  Patient Care Team: Ria Bush, MD as PCP - General (Family Medicine)  CHIEF COMPLAINTS/PURPOSE OF CONSULTATION:  ***  #   No history exists.     HISTORY OF PRESENTING ILLNESS:  David Moss 51 y.o.  male     ROS: A complete 10 point review of system is done which is negative except mentioned above in history of present illness  MEDICAL HISTORY:  Past Medical History:  Diagnosis Date  . GERD (gastroesophageal reflux disease)   . Migraine     SURGICAL HISTORY: Past Surgical History:  Procedure Laterality Date  . COLONOSCOPY  05/2016   serrated adenoma and HP, diverticulosis, rpt 5 yrs Henrene Pastor)  . ESOPHAGOGASTRODUODENOSCOPY  05/2016   GERD with esophagitis  . EXCISION MASS NECK Left 02/26/2017   Procedure: EXCISION OF SUBCUTANEOUS MASS NECK LEFT;  Surgeon: Clyde Canterbury, MD;  Location: [facility];  Service: ENT;  Laterality: Left;  . INGUINAL HERNIA REPAIR Left 2011   [name]  . MENISCUS REPAIR Right 04/2015    SOCIAL HISTORY: Social History   Social History  . Marital status: Married    Spouse name: N/A  . Number of children: 1  . Years of education: N/A   Occupational History  . landscaper    Social History Main Topics  . Smoking status: Former Smoker    Quit date: 05/04/2004  . Smokeless tobacco: Former Systems developer    Types: Chew    Quit date: 07/03/2003     Comment: 15 yrs smokeless tobacco  . Alcohol use 0.0 oz/week     Comment: Social - 4x/yr  . Drug use: No  . Sexual activity: Yes    Partners: Female   Other Topics Concern  . Not on file   Social History Narrative   Lives with wife and daughter, 1 dog   Occupation: Biomedical scientist, farmer   Edu: HS   Activity: stays active at work and on farm   Diet: good water, fruits/vegetables daily    FAMILY HISTORY: Family History  Problem Relation Age of Onset  . Asthma Mother   . Migraines Mother   . Stroke Mother   . Colon polyps Mother   . CAD  Neg Hx   . Cancer Neg Hx   . Diabetes Neg Hx   . Hypertension Neg Hx     ALLERGIES:  has No Known Allergies.  MEDICATIONS:  Current Outpatient Prescriptions  Medication Sig Dispense Refill  . cyclobenzaprine (FLEXERIL) 10 MG tablet Take 1 tablet (10 mg total) by mouth 2 (two) times daily as needed for muscle spasms (sedation precautions). 30 tablet 0  . HYDROcodone-acetaminophen (NORCO/VICODIN) 5-325 MG tablet Take 1-2 tablets by mouth every 6 (six) hours as needed for moderate pain. 30 tablet 0  . omeprazole (PRILOSEC) 40 MG capsule Take 1 capsule (40 mg total) by mouth daily. 30 capsule 11  . ranitidine (ZANTAC) 150 MG tablet Take 1 tablet (150 mg total) by mouth at bedtime. 30 tablet 11  . SUMAtriptan (IMITREX) 100 MG tablet Take 1 tablet (100 mg total) by mouth once. May repeat in 2 hours if headache persists or recurs. 10 tablet 3   No current facility-administered medications for this visit.       Marland Kitchen  PHYSICAL EXAMINATION: ECOG PERFORMANCE STATUS: {CHL ONC ECOG [phone]}  There were no vitals filed for this visit. There were no vitals filed for this visit.  GENERAL: Well-nourished well-developed; Alert, no distress and comfortable.   ***  EYES: no pallor or icterus OROPHARYNX: no thrush or ulceration; good dentition  NECK: supple, no masses felt LYMPH:  no palpable lymphadenopathy in the cervical, axillary or inguinal regions LUNGS: clear to auscultation and  No wheeze or crackles HEART/CVS: regular rate & rhythm and no murmurs; No lower extremity edema ABDOMEN: abdomen soft, non-tender and normal bowel sounds Musculoskeletal:no cyanosis of digits and no clubbing  PSYCH: alert & oriented x 3 with fluent speech NEURO: no focal motor/sensory deficits SKIN:  no rashes or significant lesions  LABORATORY DATA:  I have reviewed the data as listed Lab Results  Component Value Date   WBC 5.6 05/30/2011   HGB 14.5 05/30/2011   HCT 39.0 12/21/2009   MCV 87.2 12/21/2009    PLT 350 12/21/2009   No results for input(s): NA, K, CL, CO2, GLUCOSE, BUN, CREATININE, CALCIUM, GFRNONAA, GFRAA, PROT, ALBUMIN, AST, ALT, ALKPHOS, BILITOT, BILIDIR, IBILI in the last 8760 hours.  RADIOGRAPHIC STUDIES: I have personally reviewed the radiological images as listed and agreed with the findings in the report. No results found.  ASSESSMENT & PLAN:   No problem-specific Assessment & Plan notes found for this encounter.    All questions were answered. The patient knows to call the clinic with any problems, questions or concerns.       Cammie Sickle, MD 03/19/2017 7:18 AM

## 2017-05-28 IMAGING — CT CT ABD-PELV W/ CM
2 of 5 series · 16 of 46 positions shown, 18 images · IV contrast (ISOVUE 300)
Comparison: None.

CLINICAL DATA: 6 month hx of mid abd pain, nausea and vomiting in
the morning. Hx of hernia repair in 6891. No bowel changes. Normal
colonoscopy and endoscopy last week.

EXAM:
CT ABDOMEN AND PELVIS WITH CONTRAST
TECHNIQUE: Multidetector CT imaging of the abdomen and pelvis was performed
using the standard protocol following bolus administration of
intravenous contrast.
CONTRAST:  100mL ACGBSU-TOO IOPAMIDOL (ACGBSU-TOO) INJECTION 61%

[Series 2: abd/pel w · axial · 0.88mm/px · z∈[-660,-210]mm · 13 of 102 slices shown, 15 images]
[im 6/102  soft-tissue]
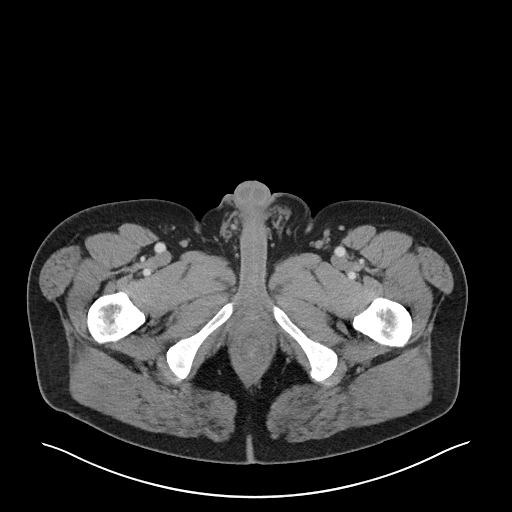
[im 6/102  bone]
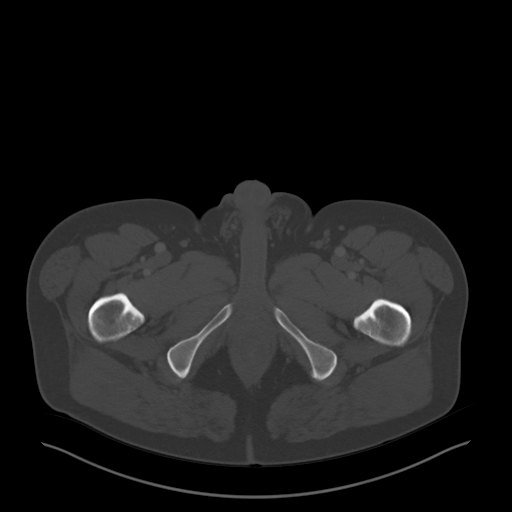
[im 16/102  soft-tissue]
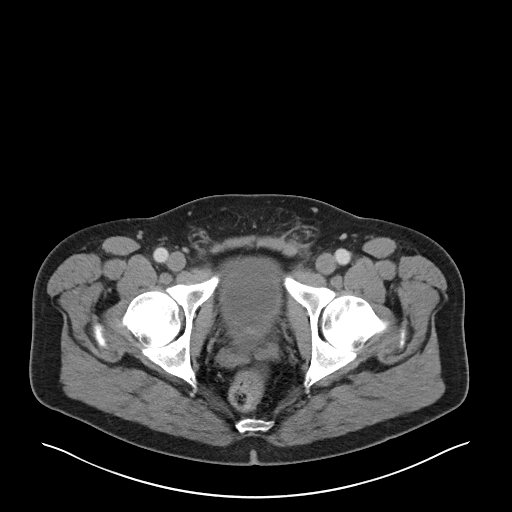
[im 22/102  soft-tissue]
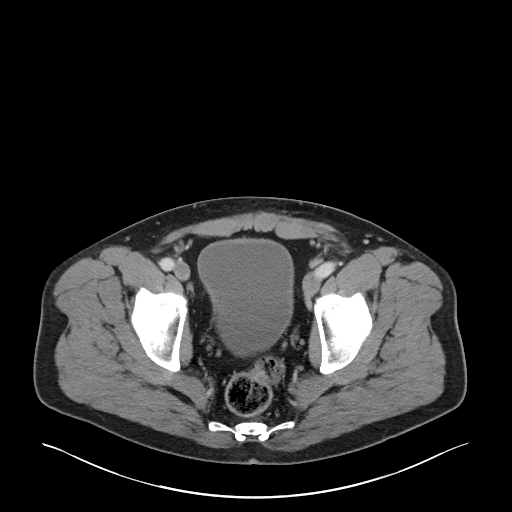
[im 27/102  soft-tissue]
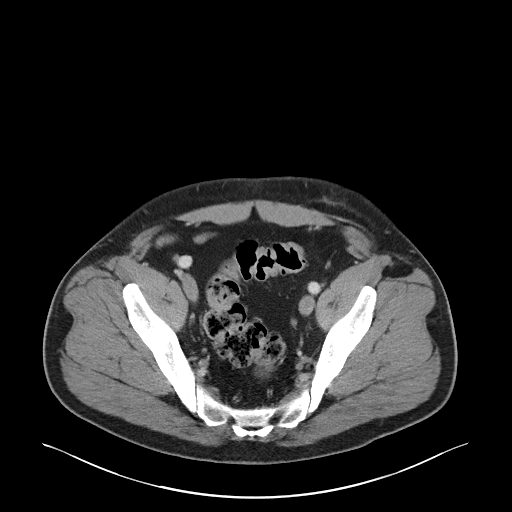
[im 38/102  soft-tissue]
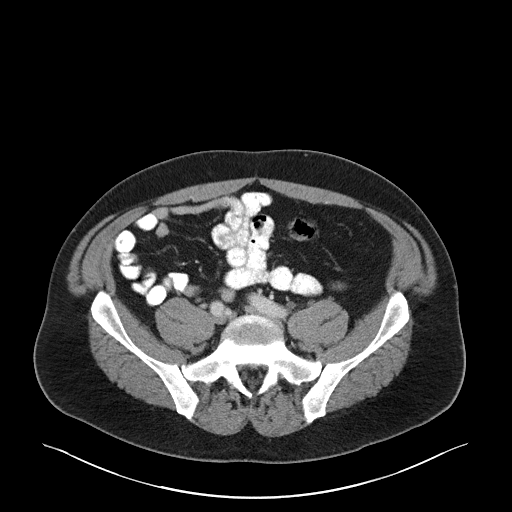
[im 43/102  soft-tissue]
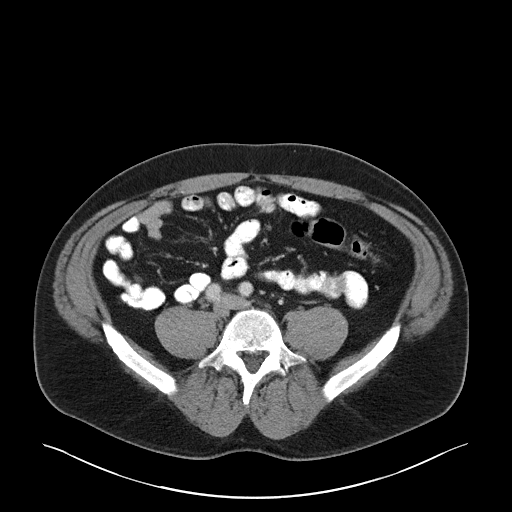
[im 54/102  soft-tissue]
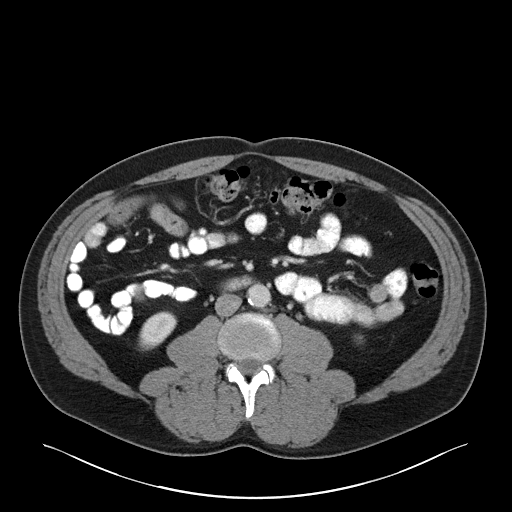
[im 59/102  soft-tissue]
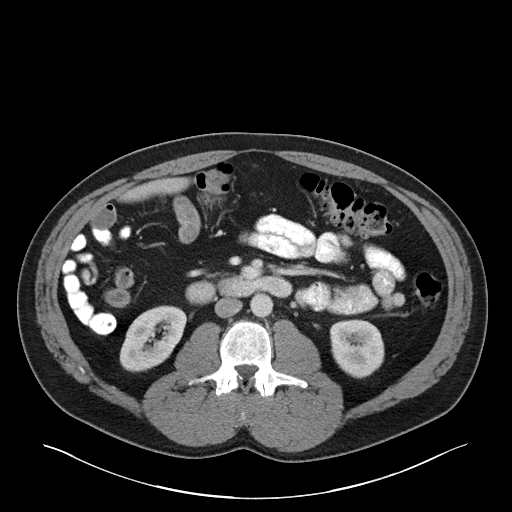
[im 64/102  soft-tissue]
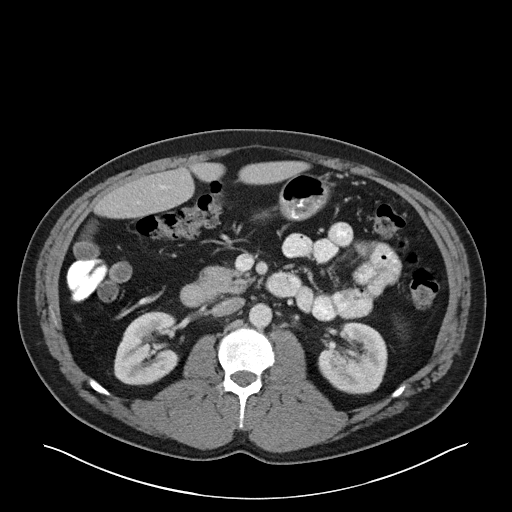
[im 64/102  bone]
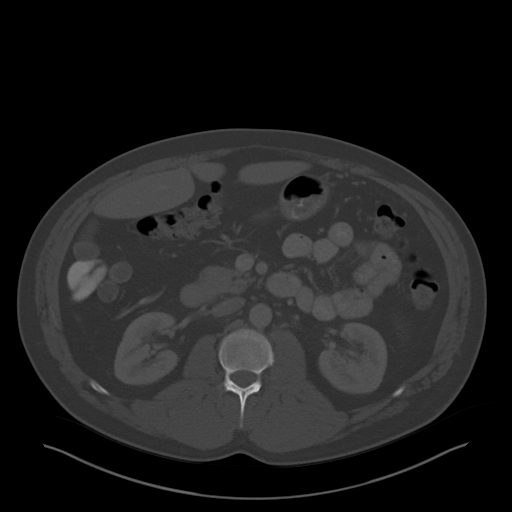
[im 75/102  soft-tissue]
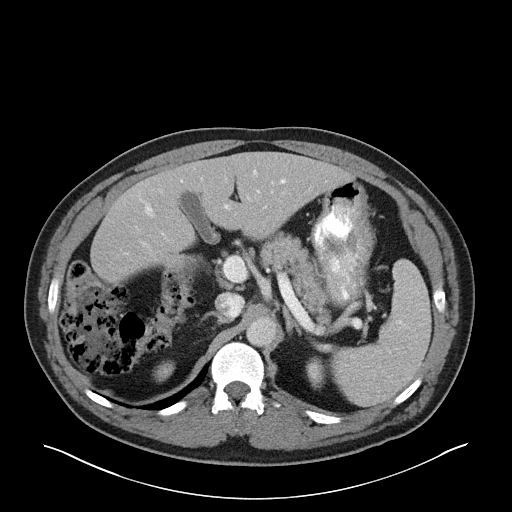
[im 80/102  soft-tissue]
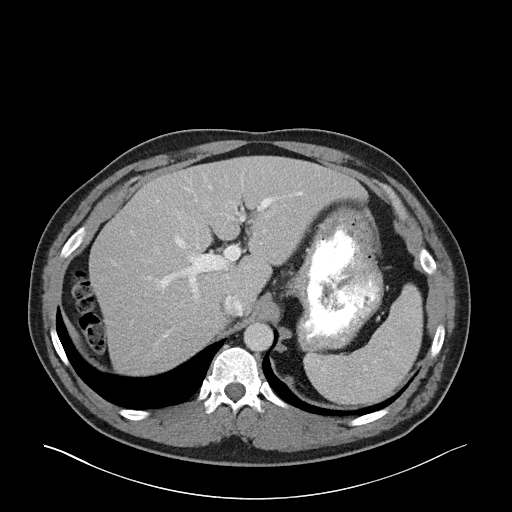
[im 86/102  soft-tissue]
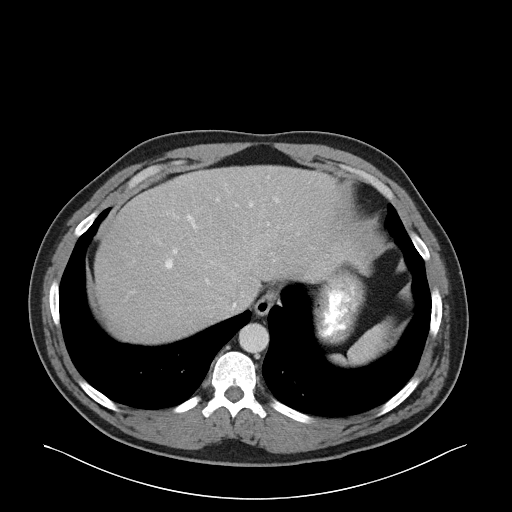
[im 96/102  soft-tissue]
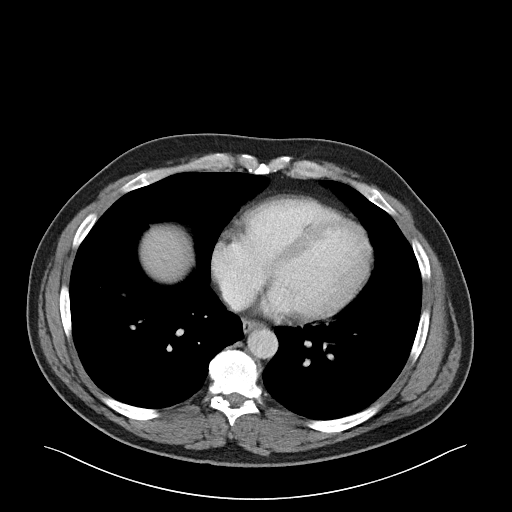

[Series 6: abd/pel w st · coronal · 0.80mm/px · 3 of 94 slices shown]
[im 32/94  soft-tissue]
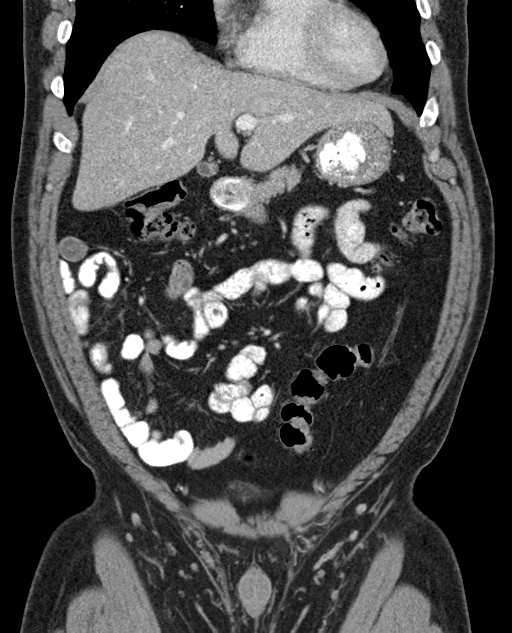
[im 42/94  soft-tissue]
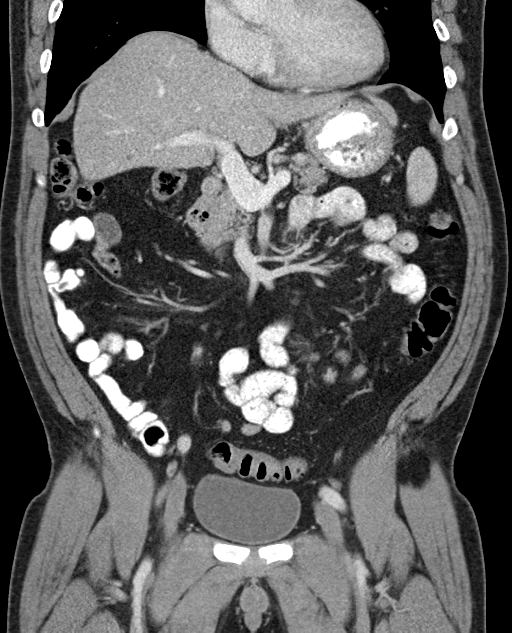
[im 52/94  soft-tissue]
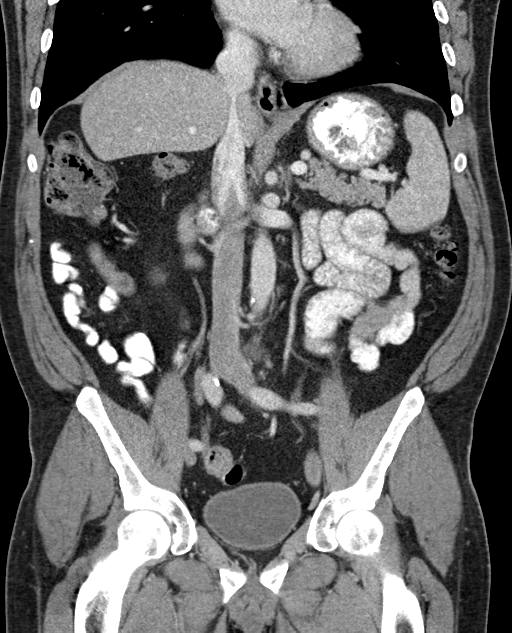

[16 of 46 positions shown; findings below may reference images not displayed]

FINDINGS: Lower chest: Lung bases are clear.

Hepatobiliary: No focal hepatic lesion. No biliary duct dilatation.
Gallbladder is normal. Common bile duct is normal.

Pancreas: Pancreas is normal. No ductal dilatation. No pancreatic
inflammation.

Spleen: Normal spleen

Adrenals/urinary tract: Adrenal glands and kidneys are normal. The
ureters and bladder normal.

Stomach/Bowel: Stomach, small bowel, appendix, and cecum are normal.
The colon and rectosigmoid colon are normal.

Vascular/Lymphatic: Abdominal aorta is normal caliber. There is no
retroperitoneal or periportal lymphadenopathy. No pelvic
lymphadenopathy.

Reproductive: Prostate normal

Other: No free fluid.

Musculoskeletal: No aggressive osseous lesion.
IMPRESSION: 1. No acute findings in abdomen or pelvis.
2. No bowel abnormality.

## 2017-07-10 ENCOUNTER — Ambulatory Visit: Payer: BLUE CROSS/BLUE SHIELD

## 2017-12-09 ENCOUNTER — Ambulatory Visit: Payer: Self-pay

## 2017-12-09 NOTE — Telephone Encounter (Signed)
Pt has a fever and not feeling well; pt has had a tick bite  No return call at this time. Message left previously for pt to call office back to discuss symptoms.

## 2017-12-09 NOTE — Telephone Encounter (Signed)
Patient called, left VM to return call to the office to speak to a TN about his fever, not feeling well after a tick bite.

## 2018-09-22 ENCOUNTER — Other Ambulatory Visit: Payer: Self-pay | Admitting: *Deleted

## 2018-09-22 NOTE — Telephone Encounter (Signed)
Patient's wife called requesting a refill on Flexeril for his back and migraine medication. Magda Paganini stated that patient is doing just fine, but wants to have medication on hand if he needs it with everything that is going on now. Magda Paganini stated that patient will be glad to come in for a visit after this crisis is over with the coronavirus, but has an elderly person at home taking cancer treatments and does not want to expose them to anything.   Last office visit 02/15/17 Flexeril last refill 11/21/15 #30 Imitrex last refill 10/24/15 #10/3 Pharmacy [facility]

## 2018-09-25 MED ORDER — SUMATRIPTAN SUCCINATE 100 MG PO TABS: 100.0000 mg | ORAL_TABLET | Freq: Once | ORAL | 3 refills | Status: DC

## 2018-09-25 MED ORDER — CYCLOBENZAPRINE HCL 10 MG PO TABS: 10.0000 mg | ORAL_TABLET | Freq: Two times a day (BID) | ORAL | 1 refills | Status: AC | PRN

## 2018-09-25 NOTE — Telephone Encounter (Signed)
Sure I have sent in for him.

## 2019-10-30 ENCOUNTER — Ambulatory Visit (INDEPENDENT_AMBULATORY_CARE_PROVIDER_SITE_OTHER): Payer: BC Managed Care – PPO | Admitting: Family Medicine

## 2019-10-30 ENCOUNTER — Other Ambulatory Visit: Payer: Self-pay

## 2019-10-30 ENCOUNTER — Encounter: Payer: Self-pay | Admitting: Family Medicine

## 2019-10-30 VITALS — BP 128/86 | HR 68 | Temp 97.6°F | Ht 74.0 in | Wt 240.1 lb

## 2019-10-30 DIAGNOSIS — R221 Localized swelling, mass and lump, neck: Secondary | ICD-10-CM | POA: Diagnosis not present

## 2019-10-30 DIAGNOSIS — K2100 Gastro-esophageal reflux disease with esophagitis, without bleeding: Secondary | ICD-10-CM

## 2019-10-30 MED ORDER — OMEPRAZOLE 40 MG PO CPDR: 40.0000 mg | DELAYED_RELEASE_CAPSULE | Freq: Every day | ORAL | 11 refills | Status: DC

## 2019-10-30 MED ORDER — FAMOTIDINE 20 MG PO TABS: 20.0000 mg | ORAL_TABLET | Freq: Two times a day (BID) | ORAL | Status: DC | PRN

## 2019-10-30 NOTE — Assessment & Plan Note (Addendum)
Exam consistent with L tonsillar and cervical lymphadenopathy - in setting of recent covid vaccine 10/07/2019. No red flags.  Discussed we often are seeing LAD after COVID vaccines.  However he does have h/o L neck mass with ?fibromatosis..  Will check labs - CBC, CMP. Did suggest we give LAD some time to see if they will clear on their own (suggesting reactive LAD after COVID vaccine). rec wait 3-4 wks after 2nd shot (scheduled 11/06/2019) and if ongoing LAD at that time, will order contrasted neck CT for further evaluation. Pt agrees with plan.

## 2019-10-30 NOTE — Assessment & Plan Note (Addendum)
He had been using PRN zantac -advised stop this as it's been removed from the market due to concerns over [facility] contaminant.  Prior EGD with esophagitis. Given recurrent symptoms, will Rx omeprazole 40mg  daily. Discussed omeprazole and pepcid use.

## 2019-10-30 NOTE — Progress Notes (Signed)
This visit was conducted in person.  BP 128/86   Pulse 68   Temp 97.6 F (36.4 C)   Ht 6\' 2"  (1.88 m)   Wt 240 lb 2 oz (108.9 kg)   SpO2 97%   BMI 30.83 kg/m    CC: bumps on left neck Subjective:    Patient ID: David Moss, male    DOB: Nov 04, 1965, 54 y.o.   MRN: [id number]  HPI: David Moss is a 54 y.o. male presenting on 10/30/2019 for Nodules on left side of the neck (x 2 weeks. some tenderness present)   2 wk h/o swollen lymph nodes to left neck more noticeable when turning head. No trouble on the right side. Notes some irritation with swallowing, more noticeable with hard solid textures like meats. Some globus senstation. This can lead to AM vomiting about 3 times a week (over the last 2 weeks).   No fevers/chills, recent URI, congestion, abd pain, nausea. No hoarse voice. Weight gain noted. Appetite ok.   No fmhx thyroid disease or cancer.  Ex smoker - and ex- smokeless tobacco - quit both 2005  Known GERD with esophagitis by EGD 2017.  Reflux better controlled off spicy foods.  Has been managing with OTC zantac (a few pills every few weeks).   No known COVID exposure. Has done ok through pandemic. Wife had COVID 08/2019. He did have first covid shot 10/17/2019.Marland Kitchen  Last seen here 01/2017 for L neck mass with abnormal ultrasound, referred to ENT s/p excision. Pathology showed: DIAGNOSIS: A. LEFT NECK MASS; EXCISION: - DEEP/AGGRESSIVE FIBROMATOSIS, SEE COMMENT. - THE LESION EXTENDS TO THE INKED MARGIN OF RESECTION. Comment: The case was amended to remove the entire previous diagnosis which did not belong to this patient.  Amended results and notification of current pending status were discussed with Dr. Richardson Landry at 143 PM on 03/13/17.  Due to unusual histologic features the case was sent to [facility], [place], [place] for consultation. This is the comment of the external consultant D r. 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  Although it forms a nodule and bears some resemblance to nodular fasciitis, infiltration of skeletal muscle excludes this possibility. Amended results were discussed with Dr. Richardson Landry at 11 AM on 03/18/17.     Relevant past medical, surgical, family and social history reviewed and updated as indicated. Interim medical history since our last visit reviewed. Allergies and medications reviewed and updated. Outpatient Medications Prior to Visit  Medication Sig Dispense Refill  . cyclobenzaprine (FLEXERIL) 10 MG tablet Take 1 tablet (10 mg total) by mouth 2 (two) times daily as needed for muscle spasms (sedation precautions). 30 tablet 1  . HYDROcodone-acetaminophen (NORCO/VICODIN) 5-325 MG tablet Take 1-2 tablets by mouth every 6 (six) hours as needed for moderate pain. 30 tablet 0  . SUMAtriptan (IMITREX) 100 MG tablet Take 1 tablet (100 mg total) by mouth once for 1 dose. May repeat in 2 hours if headache persists or recurs. 10 tablet 3  . omeprazole (PRILOSEC) 40 MG capsule Take 1 capsule (40 mg total) by mouth daily. 30 capsule 11  . ranitidine (ZANTAC) 150 MG tablet Take 1 tablet (150 mg total) by mouth at bedtime. 30 tablet 11   No facility-administered medications prior to visit.     Per HPI unless specifically indicated in ROS section below Review of Systems Objective:  BP 128/86   Pulse 68   Temp  97.6 F (36.4 C)   Ht 6\' 2"  (1.88 m)   Wt 240 lb 2 oz (108.9 kg)   SpO2 97%   BMI 30.83 kg/m   Wt Readings from Last 3 Encounters:  10/30/19 240 lb 2 oz (108.9 kg)  02/26/17 232 lb (105.2 kg)  02/15/17 232 lb 8 oz (105.5 kg)      Physical Exam Vitals and nursing note reviewed.  Constitutional:      Appearance: Normal appearance. He is obese. He is not ill-appearing.  HENT:     Head: Normocephalic and atraumatic.     Right Ear: Tympanic membrane, ear canal and external ear normal. There is no impacted cerumen.     Left Ear: Tympanic  membrane, ear canal and external ear normal. There is no impacted cerumen.     Mouth/Throat:     Mouth: Mucous membranes are moist.     Pharynx: Oropharynx is clear. No oropharyngeal exudate or posterior oropharyngeal erythema.  Neck:     Thyroid: No thyroid mass, thyromegaly or thyroid tenderness.  Cardiovascular:     Rate and Rhythm: Normal rate and regular rhythm.     Pulses: Normal pulses.     Heart sounds: Normal heart sounds. No murmur.  Pulmonary:     Effort: Pulmonary effort is normal. No respiratory distress.     Breath sounds: Normal breath sounds. No wheezing, rhonchi or rales.  Abdominal:     General: Bowel sounds are normal. There is no distension.     Palpations: Abdomen is soft. There is no hepatomegaly, splenomegaly or mass.     Tenderness: There is no abdominal tenderness. There is no guarding or rebound.     Hernia: No hernia is present.  Musculoskeletal:     Cervical back: Normal range of motion and neck supple.     Right lower leg: No edema.     Left lower leg: No edema.  Lymphadenopathy:     Head:     Right side of head: No submental, submandibular, tonsillar, preauricular, posterior auricular or occipital adenopathy.     Left side of head: Submandibular and tonsillar adenopathy present. No submental, preauricular, posterior auricular or occipital adenopathy.     Cervical: Cervical adenopathy present.     Right cervical: No superficial or posterior cervical adenopathy.    Left cervical: Superficial cervical adenopathy present. No posterior cervical adenopathy.     Upper Body:     Right upper body: No supraclavicular or axillary adenopathy.     Left upper body: No supraclavicular or axillary adenopathy.  Skin:    Findings: No erythema or rash.  Neurological:     Mental Status: He is alert.  Psychiatric:        Mood and Affect: Mood normal.        Behavior: Behavior normal.        Lab Results  Component Value Date   CREATININE 0.99 02/24/2016   BUN 13  02/24/2016   NA 138 02/24/2016   K 4.2 02/24/2016   CL 104 02/24/2016   CO2 28 02/24/2016    US Soft Tissue Head/Neck CLINICAL DATA:  Left-sided neck mass for 2 weeks.  EXAM: ULTRASOUND OF HEAD/NECK SOFT TISSUES  TECHNIQUE: Ultrasound examination of the head and neck soft tissues was performed in the area of clinical concern.  COMPARISON:  None.  FINDINGS: There is a hypoechoic nodule in the superficial tissues at the area of concern along the left side of the neck. Lesion measures 2.2 x 0.9  x 1.8 cm. Majority of the lesion has smooth margins but there are 1-2 areas with a lobulated or nodular contour. Structure is mildly heterogeneous. There is no significant blood flow identified within this lesion.  IMPRESSION: Mildly irregular hypoechoic nodular structure at the area of concern. This structure measures up to 2.2 cm. The etiology of this structure is uncertain. This is not consistent with a normal lymph node and does not represent a simple cyst. Structure could be inflammatory in etiology. Neoplastic process cannot be excluded based on these images. Consider short-term follow-up or further characterization with a CT neck with IV contrast.  Electronically Signed   By: Markus Daft M.D.   On: 02/15/2017 14:58   Assessment & Plan:  This visit occurred during the SARS-CoV-2 public health emergency.  Safety protocols were in place, including screening questions prior to the visit, additional usage of staff PPE, and extensive cleaning of exam room while observing appropriate contact time as indicated for disinfecting solutions.   Problem List Items Addressed This Visit    Mass of left side of neck - Primary    Exam consistent with L tonsillar and cervical lymphadenopathy - in setting of recent covid vaccine 10/07/2019. No red flags.  Discussed we often are seeing LAD after COVID vaccines.  However he does have h/o L neck mass with ?fibromatosis..  Will check labs - CBC, CMP.  Did suggest we give LAD some time to see if they will clear on their own (suggesting reactive LAD after COVID vaccine). rec wait 3-4 wks after 2nd shot (scheduled 11/06/2019) and if ongoing LAD at that time, will order contrasted neck CT for further evaluation. Pt agrees with plan.       Relevant Orders   CBC with Differential/Platelet   Comprehensive metabolic panel   GERD (gastroesophageal reflux disease)    He had been using PRN zantac -advised stop this as it's been removed from the market due to concerns over [facility] contaminant.  Prior EGD with esophagitis. Given recurrent symptoms, will Rx omeprazole 40mg  daily. Discussed omeprazole and pepcid use.       Relevant Medications   omeprazole (PRILOSEC) 40 MG capsule   famotidine (PEPCID) 20 MG tablet       Meds ordered this encounter  Medications  . omeprazole (PRILOSEC) 40 MG capsule    Sig: Take 1 capsule (40 mg total) by mouth daily.    Dispense:  30 capsule    Refill:  11  . famotidine (PEPCID) 20 MG tablet    Sig: Take 1 tablet (20 mg total) by mouth 2 (two) times daily as needed for heartburn or indigestion.   Orders Placed This Encounter  Procedures  . CBC with Differential/Platelet  . Comprehensive metabolic panel    Patient Instructions  Stop zantac as it's now off the market. May take pepcid (famotidine) in its place or for stronger medicine, can use omeprazole 40mg  (sent to pharmacy). Taking heartburn medicine may help swallowing symptoms.  Swollen glands may be side effect to covid vaccine. Wait 3-4 wks until after your second vaccine and if persistent neck swelling, let me know for CT scan. Let me know sooner if worsening swelling or worsening symptoms.    Follow up plan: Return if symptoms worsen or fail to improve.  Ria Bush, MD

## 2019-10-30 NOTE — Patient Instructions (Addendum)
Stop zantac as it's now off the market. May take pepcid (famotidine) in its place or for stronger medicine, can use omeprazole 40mg  (sent to pharmacy). Taking heartburn medicine may help swallowing symptoms.  Swollen glands may be side effect to covid vaccine. Wait 3-4 wks until after your second vaccine and if persistent neck swelling, let me know for CT scan. Let me know sooner if worsening swelling or worsening symptoms.

## 2019-10-31 LAB — COMPREHENSIVE METABOLIC PANEL
AG Ratio: 1.9 (calc) (ref 1.0–2.5)
ALT: 23 U/L (ref 9–46)
AST: 17 U/L (ref 10–35)
Albumin: 4.3 g/dL (ref 3.6–5.1)
Alkaline phosphatase (APISO): 45 U/L (ref 35–144)
BUN: 16 mg/dL (ref 7–25)
CO2: 25 mmol/L (ref 20–32)
Calcium: 8.8 mg/dL (ref 8.6–10.3)
Chloride: 105 mmol/L (ref 98–110)
Creat: 1.22 mg/dL (ref 0.70–1.33)
Globulin: 2.3 g/dL (calc) (ref 1.9–3.7)
Glucose, Bld: 108 mg/dL — ABNORMAL HIGH (ref 65–99)
Potassium: 4.3 mmol/L (ref 3.5–5.3)
Sodium: 139 mmol/L (ref 135–146)
Total Bilirubin: 1.3 mg/dL — ABNORMAL HIGH (ref 0.2–1.2)
Total Protein: 6.6 g/dL (ref 6.1–8.1)

## 2019-10-31 LAB — CBC WITH DIFFERENTIAL/PLATELET
Absolute Monocytes: 536 cells/uL (ref 200–950)
Basophils Absolute: 31 cells/uL (ref 0–200)
Basophils Relative: 0.6 %
Eosinophils Absolute: 158 cells/uL (ref 15–500)
Eosinophils Relative: 3.1 %
HCT: 42.4 % (ref 38.5–50.0)
Hemoglobin: 14.1 g/dL (ref 13.2–17.1)
Lymphs Abs: 1443 cells/uL (ref 850–3900)
MCH: 29 pg (ref 27.0–33.0)
MCHC: 33.3 g/dL (ref 32.0–36.0)
MCV: 87.2 fL (ref 80.0–100.0)
MPV: 12.2 fL (ref 7.5–12.5)
Monocytes Relative: 10.5 %
Neutro Abs: 2933 cells/uL (ref 1500–7800)
Neutrophils Relative %: 57.5 %
Platelets: 200 10*3/uL (ref 140–400)
RBC: 4.86 10*6/uL (ref 4.20–5.80)
RDW: 12.4 % (ref 11.0–15.0)
Total Lymphocyte: 28.3 %
WBC: 5.1 10*3/uL (ref 3.8–10.8)

## 2019-11-02 ENCOUNTER — Telehealth: Payer: Self-pay

## 2019-11-02 NOTE — Telephone Encounter (Signed)
Ria Bush, MD  11/01/2019 10:05 PM EDT    Plz notify blood counts, kidneys, liver returned normal. We will watch neck swelling after recent covid shot, if not improving we will proceed with neck CT. Let us know how he does.

## 2019-11-04 NOTE — Telephone Encounter (Signed)
Results mailed to patient.  [see Labs, 10/30/19]

## 2019-12-03 ENCOUNTER — Ambulatory Visit: Payer: BC Managed Care – PPO | Admitting: Family Medicine

## 2021-05-18 ENCOUNTER — Encounter: Payer: Self-pay | Admitting: Internal Medicine

## 2021-06-08 ENCOUNTER — Telehealth: Payer: Self-pay | Admitting: Family Medicine

## 2021-06-08 NOTE — Telephone Encounter (Signed)
Spoke with pt asking about sxs and tried to offer visit.  Pt states he's "alright", didn't provide any sxs and declined visit offer.  Encouraged pt to call back if he is feeling bad and that we can also do video visits if necessary.  Pt verbalizes understanding.

## 2021-06-08 NOTE — Telephone Encounter (Signed)
Pt wife called wanting an order for a flu test for pt. Please advise.

## 2021-06-09 ENCOUNTER — Telehealth: Payer: BC Managed Care – PPO | Admitting: Nurse Practitioner

## 2021-06-09 ENCOUNTER — Other Ambulatory Visit: Payer: Self-pay

## 2022-06-23 ENCOUNTER — Encounter (HOSPITAL_COMMUNITY): Payer: Self-pay | Admitting: Internal Medicine

## 2022-06-23 ENCOUNTER — Other Ambulatory Visit: Payer: Self-pay

## 2022-06-23 ENCOUNTER — Observation Stay (HOSPITAL_COMMUNITY): Payer: Managed Care, Other (non HMO)

## 2022-06-23 ENCOUNTER — Inpatient Hospital Stay (HOSPITAL_COMMUNITY)
Admission: EM | Admit: 2022-06-23 | Discharge: 2022-06-26 | DRG: 914 | Disposition: A | Discharge status: Home / Self Care | Payer: Managed Care, Other (non HMO) | Attending: Internal Medicine | Admitting: Internal Medicine

## 2022-06-23 ENCOUNTER — Emergency Department (HOSPITAL_COMMUNITY): Payer: Managed Care, Other (non HMO)

## 2022-06-23 DIAGNOSIS — Z79899 Other long term (current) drug therapy: Secondary | ICD-10-CM

## 2022-06-23 DIAGNOSIS — K219 Gastro-esophageal reflux disease without esophagitis: Secondary | ICD-10-CM | POA: Diagnosis present

## 2022-06-23 DIAGNOSIS — I10 Essential (primary) hypertension: Secondary | ICD-10-CM | POA: Diagnosis present

## 2022-06-23 DIAGNOSIS — X500XXA Overexertion from strenuous movement or load, initial encounter: Secondary | ICD-10-CM

## 2022-06-23 DIAGNOSIS — Z6830 Body mass index (BMI) 30.0-30.9, adult: Secondary | ICD-10-CM

## 2022-06-23 DIAGNOSIS — M62838 Other muscle spasm: Secondary | ICD-10-CM | POA: Diagnosis present

## 2022-06-23 DIAGNOSIS — I4510 Unspecified right bundle-branch block: Secondary | ICD-10-CM | POA: Diagnosis present

## 2022-06-23 DIAGNOSIS — E669 Obesity, unspecified: Secondary | ICD-10-CM | POA: Diagnosis present

## 2022-06-23 DIAGNOSIS — Z83719 Family history of colon polyps, unspecified: Secondary | ICD-10-CM

## 2022-06-23 DIAGNOSIS — Z23 Encounter for immunization: Secondary | ICD-10-CM

## 2022-06-23 DIAGNOSIS — S36899A Unspecified injury of other intra-abdominal organs, initial encounter: Secondary | ICD-10-CM | POA: Diagnosis not present

## 2022-06-23 DIAGNOSIS — I959 Hypotension, unspecified: Secondary | ICD-10-CM | POA: Diagnosis present

## 2022-06-23 DIAGNOSIS — R55 Syncope and collapse: Secondary | ICD-10-CM | POA: Diagnosis not present

## 2022-06-23 DIAGNOSIS — N179 Acute kidney failure, unspecified: Secondary | ICD-10-CM | POA: Diagnosis present

## 2022-06-23 DIAGNOSIS — Z720 Tobacco use: Secondary | ICD-10-CM

## 2022-06-23 DIAGNOSIS — K2100 Gastro-esophageal reflux disease with esophagitis, without bleeding: Secondary | ICD-10-CM | POA: Diagnosis present

## 2022-06-23 DIAGNOSIS — F1011 Alcohol abuse, in remission: Secondary | ICD-10-CM | POA: Diagnosis present

## 2022-06-23 DIAGNOSIS — K760 Fatty (change of) liver, not elsewhere classified: Secondary | ICD-10-CM | POA: Diagnosis present

## 2022-06-23 DIAGNOSIS — G43909 Migraine, unspecified, not intractable, without status migrainosus: Secondary | ICD-10-CM | POA: Diagnosis present

## 2022-06-23 DIAGNOSIS — Z825 Family history of asthma and other chronic lower respiratory diseases: Secondary | ICD-10-CM

## 2022-06-23 DIAGNOSIS — D62 Acute posthemorrhagic anemia: Secondary | ICD-10-CM | POA: Diagnosis present

## 2022-06-23 DIAGNOSIS — K661 Hemoperitoneum: Secondary | ICD-10-CM

## 2022-06-23 DIAGNOSIS — D649 Anemia, unspecified: Secondary | ICD-10-CM | POA: Diagnosis present

## 2022-06-23 DIAGNOSIS — E872 Acidosis, unspecified: Secondary | ICD-10-CM | POA: Diagnosis present

## 2022-06-23 DIAGNOSIS — R911 Solitary pulmonary nodule: Secondary | ICD-10-CM | POA: Diagnosis present

## 2022-06-23 DIAGNOSIS — Z823 Family history of stroke: Secondary | ICD-10-CM

## 2022-06-23 DIAGNOSIS — D72829 Elevated white blood cell count, unspecified: Secondary | ICD-10-CM | POA: Diagnosis present

## 2022-06-23 HISTORY — DX: Dorsalgia, unspecified: M54.9

## 2022-06-23 LAB — CBC
HCT: 32.9 % — ABNORMAL LOW (ref 39.0–52.0)
HCT: 31.1 % — ABNORMAL LOW (ref 39.0–52.0)
HCT: 31.7 % — ABNORMAL LOW (ref 39.0–52.0)
Hemoglobin: 10.6 g/dL — ABNORMAL LOW (ref 13.0–17.0)
Hemoglobin: 10.2 g/dL — ABNORMAL LOW (ref 13.0–17.0)
Hemoglobin: 10.3 g/dL — ABNORMAL LOW (ref 13.0–17.0)
MCH: 29.5 pg (ref 26.0–34.0)
MCH: 29.7 pg (ref 26.0–34.0)
MCH: 29.3 pg (ref 26.0–34.0)
MCHC: 32.2 g/dL (ref 30.0–36.0)
MCHC: 32.8 g/dL (ref 30.0–36.0)
MCHC: 32.5 g/dL (ref 30.0–36.0)
MCV: 91.6 fL (ref 80.0–100.0)
MCV: 90.4 fL (ref 80.0–100.0)
MCV: 90.3 fL (ref 80.0–100.0)
Platelets: 270 10*3/uL (ref 150–400)
Platelets: 247 10*3/uL (ref 150–400)
Platelets: 279 10*3/uL (ref 150–400)
RBC: 3.59 MIL/uL — ABNORMAL LOW (ref 4.22–5.81)
RBC: 3.44 MIL/uL — ABNORMAL LOW (ref 4.22–5.81)
RBC: 3.51 MIL/uL — ABNORMAL LOW (ref 4.22–5.81)
RDW: 13 % (ref 11.5–15.5)
RDW: 13 % (ref 11.5–15.5)
RDW: 13.1 % (ref 11.5–15.5)
WBC: 13.1 10*3/uL — ABNORMAL HIGH (ref 4.0–10.5)
WBC: 10.6 10*3/uL — ABNORMAL HIGH (ref 4.0–10.5)
WBC: 12.9 10*3/uL — ABNORMAL HIGH (ref 4.0–10.5)
nRBC: 0 % (ref 0.0–0.2)
nRBC: 0 % (ref 0.0–0.2)
nRBC: 0 % (ref 0.0–0.2)

## 2022-06-23 LAB — CBC WITH DIFFERENTIAL/PLATELET
Abs Immature Granulocytes: 0.12 10*3/uL — ABNORMAL HIGH (ref 0.00–0.07)
Basophils Absolute: 0.1 10*3/uL (ref 0.0–0.1)
Basophils Relative: 0 %
Eosinophils Absolute: 0.1 10*3/uL (ref 0.0–0.5)
Eosinophils Relative: 1 %
HCT: 38.3 % — ABNORMAL LOW (ref 39.0–52.0)
Hemoglobin: 12.6 g/dL — ABNORMAL LOW (ref 13.0–17.0)
Immature Granulocytes: 1 %
Lymphocytes Relative: 12 %
Lymphs Abs: 2.3 10*3/uL (ref 0.7–4.0)
MCH: 29.6 pg (ref 26.0–34.0)
MCHC: 32.9 g/dL (ref 30.0–36.0)
MCV: 90.1 fL (ref 80.0–100.0)
Monocytes Absolute: 1.1 10*3/uL — ABNORMAL HIGH (ref 0.1–1.0)
Monocytes Relative: 6 %
Neutro Abs: 14.8 10*3/uL — ABNORMAL HIGH (ref 1.7–7.7)
Neutrophils Relative %: 80 %
Platelets: 316 10*3/uL (ref 150–400)
RBC: 4.25 MIL/uL (ref 4.22–5.81)
RDW: 12.8 % (ref 11.5–15.5)
WBC: 18.4 10*3/uL — ABNORMAL HIGH (ref 4.0–10.5)
nRBC: 0 % (ref 0.0–0.2)

## 2022-06-23 LAB — APTT: aPTT: 25 seconds (ref 24–36)

## 2022-06-23 LAB — COMPREHENSIVE METABOLIC PANEL
ALT: 31 U/L (ref 0–44)
AST: 25 U/L (ref 15–41)
Albumin: 3.5 g/dL (ref 3.5–5.0)
Alkaline Phosphatase: 38 U/L (ref 38–126)
Anion gap: 12 (ref 5–15)
BUN: 15 mg/dL (ref 6–20)
CO2: 22 mmol/L (ref 22–32)
Calcium: 9.4 mg/dL (ref 8.9–10.3)
Chloride: 103 mmol/L (ref 98–111)
Creatinine, Ser: 1.6 mg/dL — ABNORMAL HIGH (ref 0.61–1.24)
GFR, Estimated: 51 mL/min — ABNORMAL LOW (ref >60)
Glucose, Bld: 180 mg/dL — ABNORMAL HIGH (ref 70–99)
Potassium: 3.6 mmol/L (ref 3.5–5.1)
Sodium: 137 mmol/L (ref 135–145)
Total Bilirubin: 0.8 mg/dL (ref 0.3–1.2)
Total Protein: 5.9 g/dL — ABNORMAL LOW (ref 6.5–8.1)

## 2022-06-23 LAB — URINALYSIS, ROUTINE W REFLEX MICROSCOPIC
Bilirubin Urine: NEGATIVE
Glucose, UA: NEGATIVE mg/dL
Hgb urine dipstick: NEGATIVE
Ketones, ur: NEGATIVE mg/dL
Leukocytes,Ua: NEGATIVE
Nitrite: NEGATIVE
Protein, ur: NEGATIVE mg/dL
Specific Gravity, Urine: 1.033 — ABNORMAL HIGH (ref 1.005–1.030)
pH: 5 (ref 5.0–8.0)

## 2022-06-23 LAB — TRAUMA TEG PANEL
CFF Max Amplitude: 28.6 mm (ref 15–32)
Citrated Kaolin (R): 3.4 min — ABNORMAL LOW (ref 4.6–9.1)
Citrated Rapid TEG (MA): 67.8 mm (ref 52–70)
Lysis at 30 Minutes: 0.4 % (ref 0.0–2.6)

## 2022-06-23 LAB — MRSA NEXT GEN BY PCR, NASAL: MRSA by PCR Next Gen: NOT DETECTED

## 2022-06-23 LAB — LACTIC ACID, PLASMA
Lactic Acid, Venous: 2.9 mmol/L (ref 0.5–1.9)
Lactic Acid, Venous: 3.1 mmol/L (ref 0.5–1.9)
Lactic Acid, Venous: 4 mmol/L (ref 0.5–1.9)
Lactic Acid, Venous: 3.4 mmol/L (ref 0.5–1.9)

## 2022-06-23 LAB — TROPONIN I (HIGH SENSITIVITY)
Troponin I (High Sensitivity): 9 ng/L (ref <18)
Troponin I (High Sensitivity): 8 ng/L (ref <18)

## 2022-06-23 LAB — ABO/RH: ABO/RH(D): A POS

## 2022-06-23 LAB — PROTIME-INR
INR: 1 (ref 0.8–1.2)
Prothrombin Time: 13.4 seconds (ref 11.4–15.2)

## 2022-06-23 LAB — CBG MONITORING, ED: Glucose-Capillary: 155 mg/dL — ABNORMAL HIGH (ref 70–99)

## 2022-06-23 LAB — LIPASE, BLOOD: Lipase: 37 U/L (ref 11–51)

## 2022-06-23 LAB — HIV ANTIBODY (ROUTINE TESTING W REFLEX): HIV Screen 4th Generation wRfx: NONREACTIVE

## 2022-06-23 LAB — C-REACTIVE PROTEIN: CRP: 1.8 mg/dL — ABNORMAL HIGH (ref <1.0)

## 2022-06-23 MED ORDER — ONDANSETRON HCL 4 MG/2ML IJ SOLN: 4.0000 mg | Freq: Four times a day (QID) | INTRAMUSCULAR | Status: DC | PRN

## 2022-06-23 MED ORDER — MORPHINE SULFATE (PF) 4 MG/ML IV SOLN
INTRAVENOUS | Status: AC
  Administered 2022-06-23: 4 mg via INTRAVENOUS
  Filled 2022-06-23: qty 1

## 2022-06-23 MED ORDER — MORPHINE SULFATE (PF) 2 MG/ML IV SOLN
2.0000 mg | INTRAVENOUS | Status: DC | PRN
  Administered 2022-06-23 – 2022-06-24 (×4): 2 mg via INTRAVENOUS
  Filled 2022-06-23 (×4): qty 1

## 2022-06-23 MED ORDER — LACTATED RINGERS IV BOLUS
1000.0000 mL | Freq: Once | INTRAVENOUS | Status: AC
  Administered 2022-06-23: 1000 mL via INTRAVENOUS

## 2022-06-23 MED ORDER — LACTATED RINGERS IV SOLN: INTRAVENOUS | Status: DC

## 2022-06-23 MED ORDER — OXYCODONE-ACETAMINOPHEN 5-325 MG PO TABS
1.0000 | ORAL_TABLET | ORAL | Status: DC | PRN
  Administered 2022-06-23 – 2022-06-25 (×6): 1 via ORAL
  Filled 2022-06-23 (×6): qty 1

## 2022-06-23 MED ORDER — SODIUM CHLORIDE 0.9 % IV BOLUS: 1000.0000 mL | Freq: Once | INTRAVENOUS | Status: DC

## 2022-06-23 MED ORDER — ONDANSETRON HCL 4 MG PO TABS: 4.0000 mg | ORAL_TABLET | Freq: Four times a day (QID) | ORAL | Status: DC | PRN

## 2022-06-23 MED ORDER — ALBUTEROL SULFATE (2.5 MG/3ML) 0.083% IN NEBU: 2.5000 mg | INHALATION_SOLUTION | Freq: Four times a day (QID) | RESPIRATORY_TRACT | Status: DC | PRN

## 2022-06-23 MED ORDER — MORPHINE SULFATE (PF) 2 MG/ML IV SOLN
2.0000 mg | INTRAVENOUS | Status: DC | PRN
  Administered 2022-06-23: 2 mg via INTRAVENOUS
  Filled 2022-06-23: qty 1

## 2022-06-23 MED ORDER — FENTANYL CITRATE PF 50 MCG/ML IJ SOSY
50.0000 ug | PREFILLED_SYRINGE | Freq: Once | INTRAMUSCULAR | Status: AC
  Administered 2022-06-23: 50 ug via INTRAVENOUS
  Filled 2022-06-23: qty 1

## 2022-06-23 MED ORDER — METRONIDAZOLE 500 MG/100ML IV SOLN
500.0000 mg | Freq: Two times a day (BID) | INTRAVENOUS | Status: DC
  Administered 2022-06-23 – 2022-06-26 (×6): 500 mg via INTRAVENOUS
  Filled 2022-06-23 (×6): qty 100

## 2022-06-23 MED ORDER — PANTOPRAZOLE SODIUM 40 MG IV SOLR
40.0000 mg | INTRAVENOUS | Status: DC
  Administered 2022-06-23 – 2022-06-24 (×2): 40 mg via INTRAVENOUS
  Filled 2022-06-23 (×2): qty 10

## 2022-06-23 MED ORDER — SODIUM CHLORIDE 0.9 % IV SOLN
2.0000 g | INTRAVENOUS | Status: DC
  Administered 2022-06-23 – 2022-06-25 (×3): 2 g via INTRAVENOUS
  Filled 2022-06-23 (×3): qty 20

## 2022-06-23 MED ORDER — ONDANSETRON HCL 4 MG/2ML IJ SOLN
4.0000 mg | Freq: Once | INTRAMUSCULAR | Status: AC
  Administered 2022-06-23: 4 mg via INTRAVENOUS
  Filled 2022-06-23: qty 2

## 2022-06-23 MED ORDER — MORPHINE SULFATE (PF) 4 MG/ML IV SOLN: 4.0000 mg | Freq: Once | INTRAVENOUS | Status: AC

## 2022-06-23 MED ORDER — ACETAMINOPHEN 650 MG RE SUPP: 650.0000 mg | Freq: Four times a day (QID) | RECTAL | Status: DC | PRN

## 2022-06-23 MED ORDER — INFLUENZA VAC SPLIT QUAD 0.5 ML IM SUSY
0.5000 mL | PREFILLED_SYRINGE | INTRAMUSCULAR | Status: AC
  Administered 2022-06-26: 0.5 mL via INTRAMUSCULAR
  Filled 2022-06-23: qty 0.5

## 2022-06-23 MED ORDER — ACETAMINOPHEN 325 MG PO TABS
650.0000 mg | ORAL_TABLET | Freq: Four times a day (QID) | ORAL | Status: DC | PRN
  Administered 2022-06-25 (×3): 650 mg via ORAL
  Filled 2022-06-23 (×3): qty 2

## 2022-06-23 MED ORDER — PNEUMOCOCCAL 20-VAL CONJ VACC 0.5 ML IM SUSY
0.5000 mL | PREFILLED_SYRINGE | INTRAMUSCULAR | Status: DC
  Filled 2022-06-23: qty 0.5

## 2022-06-23 MED ORDER — LACTATED RINGERS IV BOLUS: 1000.0000 mL | Freq: Once | INTRAVENOUS | Status: DC

## 2022-06-23 MED ORDER — IOHEXOL 350 MG/ML SOLN
100.0000 mL | Freq: Once | INTRAVENOUS | Status: AC | PRN
  Administered 2022-06-23: 100 mL via INTRAVENOUS

## 2022-06-23 MED ORDER — SODIUM CHLORIDE 0.9% FLUSH
3.0000 mL | Freq: Two times a day (BID) | INTRAVENOUS | Status: DC
  Administered 2022-06-23 – 2022-06-26 (×5): 3 mL via INTRAVENOUS

## 2022-06-23 NOTE — ED Provider Triage Note (Signed)
Emergency Medicine Provider Triage Evaluation Note  Lauren Aguayo , a 56 y.o. male  was evaluated in triage.  Pt complains of sudden onset pain, that woke him from his sleep at 2:30 am, now with multiple syncopal episodes. Critically ill appearing.  Review of Systems  Positive: - Negative: -  Unable to complete ROS due to severity of presentation on arrival.   Physical Exam  BP (!) 135/100   Pulse 63   Temp (!) 97.5 F (36.4 C) (Oral)   Resp 18   SpO2 93%  Gen:   Syncopizing multiple times in triage, pale  Resp:  Tachypneic MSK:   Moves extremities without difficulty  Other:  Abdomen distended, rigid, exquisitely TTP  Medical Decision Making  Medically screening exam initiated at 7:15 AM.  Appropriate orders placed.  Leeroy Bock was informed that the remainder of the evaluation will be completed by another provider, this initial triage assessment does not replace that evaluation, and the importance of remaining in the ED until their evaluation is complete.  Patient appears critically ill appearing, transported by this provider and charge RN. I personally called EDP Dr. Ralene Bathe to the bedside. Concern for AAA or dissection; code medical activated, patient transported immediately to CT3 for further evaluation.  This chart was dictated using voice recognition software, Dragon. Despite the best efforts of this provider to proofread and correct errors, errors may still occur which can change documentation meaning.    Emeline Darling, PA-C 06/23/22 0715

## 2022-06-23 NOTE — ED Triage Notes (Signed)
Pt in with ripping lower abdominal pain that woke him from bed. Pt had 2 syncopal episodes while in triage, rushed back to ED room. Pale on presentation. 2 large bore IV's started when roomed, BP 81/62

## 2022-06-23 NOTE — Progress Notes (Signed)
IR was requested for possible angiogram and embolization.  Dr. Denna Haggard discussed with ordering provider Dr. Bobbye Morton.  It appears that patient had spontaneous omental hemorrhage that could be arising from splenic artery.  Patient is hypotensive but hgb stable at 12.6, hypotension most likely due to pain meds rather than acute bleeding.   NO urgent intervention needed.   Recommendations: - Monitor H/H and vital signs  - If patient develops symptoms and signs of persistent bleeding, obtain CT abdomen and pelvis without contrast to eval if hemorrhage is enlarging.   Armando Gang Estefanny Moler PA-C 06/23/2022 9:21 AM

## 2022-06-23 NOTE — ED Notes (Signed)
Per Dr Bobbye Morton, if bladder scan is greater than 400 insert foley catheter

## 2022-06-23 NOTE — Consult Note (Signed)
Reason for Consult/Chief Complaint: hemoperitoneum Consultant: Ralene Bathe, MD  David Moss is an 56 y.o. male.   HPI: 59M with sudden onset abdominal pain that awoke him from sleep at 0230 this AM. Pain localized to epigastrium and central abdomen. No other complaints. H/o inguinal hernia repair, no abdominal surgeries. Prior h/o heavy EtOH use, now has 2 drinks/mo, decreased EtOH unse ~5y ago.   Past Medical History:  Diagnosis Date   GERD (gastroesophageal reflux disease)    Migraine     Past Surgical History:  Procedure Laterality Date   COLONOSCOPY  05/2016   serrated adenoma and HP, diverticulosis, rpt 5 yrs Henrene Pastor)   ESOPHAGOGASTRODUODENOSCOPY  05/2016   GERD with esophagitis   EXCISION MASS NECK Left 02/26/2017   Procedure: EXCISION OF SUBCUTANEOUS MASS NECK LEFT;  Surgeon: Clyde Canterbury, MD;  Location: [facility];  Service: ENT;  Laterality: Left;   INGUINAL HERNIA REPAIR Left 2011   [facility] Right 04/2015    Family History  Problem Relation Age of Onset   Asthma Mother    Migraines Mother    Stroke Mother    Colon polyps Mother    CAD Neg Hx    Cancer Neg Hx    Diabetes Neg Hx    Hypertension Neg Hx     Social History:  reports that he has quit smoking. He quit smokeless tobacco use about 18 years ago.  His smokeless tobacco use included chew. He reports current alcohol use. He reports that he does not use drugs.  Allergies: No Known Allergies  Medications: I have reviewed the patient's current medications.  Results for orders placed or performed during the hospital encounter of 06/23/22 (from the past 48 hour(s))  CBG monitoring, ED     Status: Abnormal   Collection Time: 06/23/22  6:26 AM  Result Value Ref Range   Glucose-Capillary 155 (H) 70 - 99 mg/dL    Comment: Glucose reference range applies only to samples taken after fasting for at least 8 hours.  CBC with Differential     Status: Abnormal   Collection Time: 06/23/22   6:37 AM  Result Value Ref Range   WBC 18.4 (H) 4.0 - 10.5 K/uL   RBC 4.25 4.22 - 5.81 MIL/uL   Hemoglobin 12.6 (L) 13.0 - 17.0 g/dL   HCT 38.3 (L) 39.0 - 52.0 %   MCV 90.1 80.0 - 100.0 fL   MCH 29.6 26.0 - 34.0 pg   MCHC 32.9 30.0 - 36.0 g/dL   RDW 12.8 11.5 - 15.5 %   Platelets 316 150 - 400 K/uL   nRBC 0.0 0.0 - 0.2 %   Neutrophils Relative % 80 %   Neutro Abs 14.8 (H) 1.7 - 7.7 K/uL   Lymphocytes Relative 12 %   Lymphs Abs 2.3 0.7 - 4.0 K/uL   Monocytes Relative 6 %   Monocytes Absolute 1.1 (H) 0.1 - 1.0 K/uL   Eosinophils Relative 1 %   Eosinophils Absolute 0.1 0.0 - 0.5 K/uL   Basophils Relative 0 %   Basophils Absolute 0.1 0.0 - 0.1 K/uL   Immature Granulocytes 1 %   Abs Immature Granulocytes 0.12 (H) 0.00 - 0.07 K/uL    Comment: Performed at [facility], [address], [place], [place] [address]  Comprehensive metabolic panel     Status: Abnormal   Collection Time: 06/23/22  6:37 AM  Result Value Ref Range   Sodium 137 135 - 145  mmol/L   Potassium 3.6 3.5 - 5.1 mmol/L   Chloride 103 98 - 111 mmol/L   CO2 22 22 - 32 mmol/L   Glucose, Bld 180 (H) 70 - 99 mg/dL    Comment: Glucose reference range applies only to samples taken after fasting for at least 8 hours.   BUN 15 6 - 20 mg/dL   Creatinine, Ser 1.60 (H) 0.61 - 1.24 mg/dL   Calcium 9.4 8.9 - 10.3 mg/dL   Total Protein 5.9 (L) 6.5 - 8.1 g/dL   Albumin 3.5 3.5 - 5.0 g/dL   AST 25 15 - 41 U/L   ALT 31 0 - 44 U/L   Alkaline Phosphatase 38 38 - 126 U/L   Total Bilirubin 0.8 0.3 - 1.2 mg/dL   GFR, Estimated 51 (L) >60 mL/min    Comment: (NOTE) Calculated using the CKD-EPI Creatinine Equation (2021)    Anion gap 12 5 - 15    Comment: Performed at [facility] [address], [place], [place] [address]  Lipase, blood     Status: None   Collection Time: 06/23/22  6:37 AM  Result Value Ref Range   Lipase 37 11 - 51 U/L    Comment: Performed at [facility] [address] [place], [place]  [address]  Troponin I (High Sensitivity)     Status: None   Collection Time: 06/23/22  6:37 AM  Result Value Ref Range   Troponin I (High Sensitivity) 9 <18 ng/L    Comment: (NOTE) Elevated high sensitivity troponin I (hsTnI) values and significant  changes across serial measurements may suggest ACS but many other  chronic and acute conditions are known to elevate hsTnI results.  Refer to the "Links" section for chest pain algorithms and additional  guidance. Performed at [facility], [place] [address], [place], [place] [address]   Type and screen [place]     Status: None   Collection Time: 06/23/22  6:37 AM  Result Value Ref Range   ABO/RH(D) A POS    Antibody Screen NEG    Sample Expiration      06/26/2022,2359 Performed at [facility], [place] [address], [place], [place] [address]    Unit Number [id number]    Blood Component Type RED CELLS,LR    Unit division 00    Status of Unit DISCARDED    Unit tag comment EMERGENCY RELEASE    Transfusion Status OK TO TRANSFUSE    Crossmatch Result NOT NEEDED   Culture, blood (routine x 2)     Status: None (Preliminary result)   Collection Time: 06/23/22  7:45 AM   Specimen: BLOOD  Result Value Ref Range   Specimen Description BLOOD SITE NOT SPECIFIED    Special Requests      BOTTLES DRAWN AEROBIC AND ANAEROBIC Blood Culture adequate volume   Culture      NO GROWTH <12 HOURS Performed at [facility], [place] [address], [place], [place] [address]    Report Status PENDING   Culture, blood (routine x 2)     Status: None (Preliminary result)   Collection Time: 06/23/22  7:45 AM   Specimen: BLOOD  Result Value Ref Range   Specimen Description BLOOD SITE NOT SPECIFIED    Special Requests      BOTTLES DRAWN AEROBIC AND ANAEROBIC Blood Culture adequate volume   Culture      NO GROWTH <12 HOURS Performed at [facility], [place] [address] [place], [place] [address]    Report Status PENDING    Lactic acid, plasma     Status: Abnormal   Collection Time: 06/23/22  7:56 AM  Result Value Ref Range   Lactic Acid, Venous 2.9 (HH) 0.5 - 1.9 mmol/L    Comment: CRITICAL RESULT CALLED TO, READ BACK BY AND VERIFIED WITH E. VARENHORST RN 06/23/22 '@0854'$  BY J. WHITE Performed at [facility] [address] [place], [place] [address]   Troponin I (High Sensitivity)     Status: None   Collection Time: 06/23/22  8:39 AM  Result Value Ref Range   Troponin I (High Sensitivity) 8 <18 ng/L    Comment: (NOTE) Elevated high sensitivity troponin I (hsTnI) values and significant  changes across serial measurements may suggest ACS but many other  chronic and acute conditions are known to elevate hsTnI results.  Refer to the "Links" section for chest pain algorithms and additional  guidance. Performed at [facility], [place] [address] [place], [place] [address]     DG Chest Portable 1 View  Result Date: 06/23/2022 CLINICAL DATA:  Syncope EXAM: PORTABLE CHEST 1 VIEW COMPARISON:  Chest CT from earlier today FINDINGS: Low volume chest. There is no edema, consolidation, effusion, or pneumothorax. Low volume chest accentuated cardiac size and aortic tortuosity, CT was performed earlier the same day. IMPRESSION: Low volume chest.  No evidence of acute cardiopulmonary disease. Electronically Signed   By: Jorje Guild M.D.   On: 06/23/2022 08:12   CT Angio Chest/Abd/Pel for Dissection W and/or Wo Contrast  Result Date: 06/23/2022 CLINICAL DATA:  Concern for aortic aneurysm EXAM: CT ANGIOGRAPHY CHEST, ABDOMEN AND PELVIS TECHNIQUE: Non-contrast CT of the chest was initially obtained. Multidetector CT imaging through the chest, abdomen and pelvis was performed using the standard protocol during bolus administration of intravenous contrast. Multiplanar reconstructed images and MIPs were obtained and reviewed to evaluate the vascular anatomy. RADIATION DOSE REDUCTION: This exam was performed  according to the departmental dose-optimization program which includes automated exposure control, adjustment of the mA and/or kV according to patient size and/or use of iterative reconstruction technique. CONTRAST:  119m OMNIPAQUE IOHEXOL 350 MG/ML SOLN COMPARISON:  None Available. FINDINGS: CTA CHEST FINDINGS Cardiovascular: Preferential opacification of the thoracic aorta. No evidence of thoracic aortic aneurysm or dissection. Mild aortic atherosclerotic calcifications. Normal heart size. No pericardial effusion. Mediastinum/Nodes: No enlarged mediastinal, hilar, or axillary lymph nodes. Thyroid gland, trachea, and esophagus demonstrate no significant findings. Lungs/Pleura: No pleural fluid or airspace disease. No atelectasis or signs of pneumothorax. Subpleural nodule within the lateral left lung base measures 6 mm, image 68/7. Musculoskeletal: No chest wall abnormality. No acute or significant osseous findings. Review of the MIP images confirms the above findings. CTA ABDOMEN AND PELVIS FINDINGS VASCULAR Aorta: Normal caliber aorta without aneurysm, dissection, vasculitis or significant stenosis. Celiac: Patent without evidence of aneurysm, dissection, vasculitis or significant stenosis. SMA: Patent without evidence of aneurysm, dissection, vasculitis or significant stenosis. Renals: Both renal arteries are patent without evidence of aneurysm, dissection, vasculitis, fibromuscular dysplasia or significant stenosis. IMA: Patent without evidence of aneurysm, dissection, vasculitis or significant stenosis. Inflow: Patent without evidence of aneurysm, dissection, vasculitis or significant stenosis. Veins: No obvious venous abnormality within the limitations of this arterial phase study. Review of the MIP images confirms the above findings. NON-VASCULAR Hepatobiliary: Hepatic steatosis. No focal liver abnormality. Gallbladder appears normal. Pancreas: Unremarkable. No pancreatic ductal dilatation or surrounding  inflammatory changes.  Spleen: Perisplenic fluid noted. Heterogeneous enhancement on the spleen likely reflects arterial phase. No focal splenic abnormality identified. Adrenals/Urinary Tract: Normal appearance of the adrenal glands. Benign left kidney cyst identified. No follow-up imaging recommended. No signs of nephrolithiasis or hydronephrosis. Urinary bladder is unremarkable. Stomach/Bowel: Stomach appears normal. No pathologic dilatation of the large or small bowel loops. No bowel wall thickening, inflammation, or distension. Lymphatic: No signs of abdominopelvic adenopathy. Reproductive: Prostate is unremarkable. Other: There is low attenuation fluid within the right upper quadrant of the abdomen adjacent to the inferior margin of the right lobe of liver, image 95/6. Within the left hemiabdomen there is fat stranding and hyperdense fluid posterior to the stomach and extending along the along the left hemiabdomen into the left iliac fossa. Imaging findings are concerning for hemoperitoneum. Etiology is indeterminate. Differential considerations include non-traumatic rupture of the spleen or abdominal vasculature. There is a branch off the left gastric artery which extends into this area and there is a small, equivocal blush of contrast around the distal portion of this vessel which may reflect site of hemorrhage, image 72/100 and image 155/6. Musculoskeletal: No acute or significant osseous findings. Review of the MIP images confirms the above findings. IMPRESSION: 1. No evidence for thoracic or abdominal aortic aneurysm or dissection. 2. There is fat stranding and hyperdense fluid within the left hemiabdomen posterior to the stomach and extending along the left hemiabdomen into the left iliac fossa. Imaging findings are concerning for hemoperitoneum. Etiology is indeterminate. Differential considerations include non-traumatic rupture of the spleen or abdominal vasculature. There is a branch vessel off the left  gastric artery which extends into this area with equivocal blush of contrast around the distal portion of this vessel which may reflect site of hemorrhage. 3. Hepatic steatosis. 4. 6 mm subpleural nodule within the lateral left lung base. If the patient is at high risk for bronchogenic carcinoma, follow-up chest CT at 6-12 months is recommended. If the patient is at low risk for bronchogenic carcinoma, follow-up chest CT at 12 months is recommended. This recommendation follows the consensus statement: Guidelines for Management of Small Pulmonary Nodules Detected on CT Scans: A Statement from the [employer] as published in Radiology 2005;237:395-400. 5. Critical Value/emergent results were called by telephone at the time of interpretation on 06/23/2022 at 7:50 am to provider [facility] , who verbally acknowledged these results. Electronically Signed   By: Kerby Moors M.D.   On: 06/23/2022 07:51    ROS 10 point review of systems is negative except as listed above in HPI.   Physical Exam Blood pressure 97/72, pulse 72, temperature (!) 97.5 F (36.4 C), temperature source Oral, resp. rate 17, weight 108.9 kg, SpO2 99 %. Constitutional: well-developed, well-nourished HEENT: pupils equal, round, reactive to light, 62m b/l, moist conjunctiva, external inspection of ears and nose normal, hearing intact Oropharynx: normal oropharyngeal mucosa, normal dentition Neck: no thyromegaly, trachea midline, no midline cervical tenderness to palpation Chest: breath sounds equal bilaterally, normal respiratory effort, no midline or lateral chest wall tenderness to palpation/deformity Abdomen: soft, focal TTP in the epigastrium only, no bruising, no hepatosplenomegaly GU: no blood at urethral meatus of penis, no scrotal masses or abnormality  Back: no wounds, no thoracic/lumbar spine tenderness to palpation, no thoracic/lumbar spine stepoffs Rectal: deferred Extremities: 2+ radial and pedal pulses  bilaterally, intact motor and sensation bilateral UE and LE, no peripheral edema MSK: unable to assess gait/station, no clubbing/cyanosis of fingers/toes, normal ROM of all four extremities Skin: warm, dry,  no rashes Psych: normal memory, normal mood/affect     Assessment/Plan: 44M with hemoperitoneum. Suspect spontaneous hemorrhage from a gastric vessel, maybe related to prior alcohol use. Clincially appears fairly stable and no peritoneal signs. BP 80s at the time of my exam, but recent admin of IV narcotics. Recommend repeat CBC and IR consult. Discussed this case with Dr. Denna Haggard who is in agreement. If clinical deterioration/worsening anemia, recommend noncon CT A/P to eval and possible angiography +/- embolization. Expect this bleeding will be self-limited.    Jesusita Oka, MD General and [facility]

## 2022-06-23 NOTE — ED Provider Notes (Signed)
[facility] EMERGENCY DEPARTMENT Provider Note   CSN: [id number] Arrival date & time: 06/23/22  3976     History {Add pertinent medical, surgical, social history, OB history to HPI:1} No chief complaint on file.   David Moss is a 56 y.o. male.  The history is provided by the patient, the spouse and medical records.  David Moss is a 56 y.o. male who presents to the Emergency Department complaining of syncope, abdominal pain.  Level V caveat due to acuity of condition. He presents to the ED accompanied by his wife for evaluation of groin pain that radiates to his epigastric region that started around 230 this morning.  Pain became worse and he requested evaluation in the hospital.  Pt with syncopal episode at time of ED evaluation.       Home Medications Prior to Admission medications   Medication Sig Start Date End Date Taking? Authorizing Provider  cyclobenzaprine (FLEXERIL) 10 MG tablet Take 1 tablet (10 mg total) by mouth 2 (two) times daily as needed for muscle spasms (sedation precautions). 09/25/18   Ria Bush, MD  famotidine (PEPCID) 20 MG tablet Take 1 tablet (20 mg total) by mouth 2 (two) times daily as needed for heartburn or indigestion. 10/30/19   Ria Bush, MD  HYDROcodone-acetaminophen (NORCO/VICODIN) 5-325 MG tablet Take 1-2 tablets by mouth every 6 (six) hours as needed for moderate pain. 02/26/17   Clyde Canterbury, MD  omeprazole (PRILOSEC) 40 MG capsule Take 1 capsule (40 mg total) by mouth daily. 10/30/19   Ria Bush, MD  SUMAtriptan (IMITREX) 100 MG tablet Take 1 tablet (100 mg total) by mouth once for 1 dose. May repeat in 2 hours if headache persists or recurs. 09/25/18 10/30/19  Ria Bush, MD      Allergies    Patient has no known allergies.    Review of Systems   Review of Systems  All other systems reviewed and are negative.   Physical Exam Updated Vital Signs BP (!) 135/100   Pulse 63   Temp  (!) 97.5 F (36.4 C) (Oral)   Resp 18   SpO2 93%  Physical Exam Vitals and nursing note reviewed.  Constitutional:      General: He is in acute distress.     Appearance: He is well-developed. He is ill-appearing.  HENT:     Head: Normocephalic and atraumatic.  Cardiovascular:     Rate and Rhythm: Normal rate and regular rhythm.     Heart sounds: No murmur heard. Pulmonary:     Effort: Pulmonary effort is normal. No respiratory distress.     Breath sounds: Normal breath sounds.  Abdominal:     Palpations: Abdomen is soft.     Tenderness: There is abdominal tenderness. There is guarding and rebound.  Musculoskeletal:        General: No tenderness.  Skin:    General: Skin is warm and dry.     Coloration: Skin is pale.  Neurological:     Mental Status: He is alert and oriented to person, place, and time.  Psychiatric:        Behavior: Behavior normal.     ED Results / Procedures / Treatments   Labs (all labs ordered are listed, but only abnormal results are displayed) Labs Reviewed  CBG MONITORING, ED - Abnormal; Notable for the following components:      Result Value   Glucose-Capillary 155 (*)    All other components within normal limits  CBC  WITH DIFFERENTIAL/PLATELET  COMPREHENSIVE METABOLIC PANEL  LIPASE, BLOOD  TYPE AND SCREEN  TROPONIN I (HIGH SENSITIVITY)    EKG None  Radiology No results found.  Procedures Procedures  {Document cardiac monitor, telemetry assessment procedure when appropriate:1}  Medications Ordered in ED Medications  lactated ringers bolus 1,000 mL (has no administration in time range)    ED Course/ Medical Decision Making/ A&P                           Medical Decision Making Risk Prescription drug management.   ***  {Document critical care time when appropriate:1} {Document review of labs and clinical decision tools ie heart score, Chads2Vasc2 etc:1}  {Document your independent review of radiology images, and any  outside records:1} {Document your discussion with family members, caretakers, and with consultants:1} {Document social determinants of health affecting pt's care:1} {Document your decision making why or why not admission, treatments were needed:1} Final Clinical Impression(s) / ED Diagnoses Final diagnoses:  None    Rx / DC Orders ED Discharge Orders     None

## 2022-06-23 NOTE — Progress Notes (Signed)
Patient seen and examined. Continued abdominal pain, but still no peritoneal signs. CT imaging completed and reviewed with the interventional radiologist. Slightly larger collection of blood posterior to the stomach, but c/w hgb drop from earlier that has now stabilized. Patient has received 5L of crystalloid, would recommend avoidance of continued crystalloid administration. Noted lactic acidosis, however with 5L of LR and possible hepatic dysfunction due to prior heavy EtOH use, suspect this may be failure of the liver to clear lactate. Abdominal exam not c/w end organ malperfusion. Recommend continuing to trend LA and CBC, next CBC at 1800. Serial abdominal exams. Case also discussed with one on my senior partners.   Jesusita Oka, MD General and [facility]

## 2022-06-23 NOTE — H&P (Addendum)
History and Physical    Patient: David Moss VFI:[id number] DOB: 1965-10-03 DOA: 06/23/2022 DOS: the patient was seen and examined on 06/23/2022 PCP: Ria Bush, MD  Patient coming from: Home  Chief Complaint:  Chief Complaint  Patient presents with   Abdominal Pain   Loss of Consciousness   HPI: David Moss is a 56 y.o. male with medical history significant of migraine headaches and GERD who presented with complaints of abdominal pain.  Symptoms started around 2:30 AM this morning and woke him out of his sleep.  He reports having severe pain in the epigastric region with radiation down the center part of his abdomen.  Denies having any recent trauma or fall no onset symptoms.  His wife reports that he had passed out at least 2-3 times prior to EMS coming.  Denies having any fever, chest pain, vomiting, diarrhea, or history of heart failure.  Last bowel movement was yesterday.  He had prior history of inguinal hernia repair.  Patient has had EGD and colonoscopy last in 2017.  Records note he had esophagitis with  linear erosions present, 2 polyps removed in the sigmoid transverse colon, and multiple diverticula reported in the sigmoid colon. Patient notes associated symptoms of shortness of breath that has started since being here in the hospital and eports his stomach is distended and upper part and that is not normal for him.  In the emergency department patient was noted to be afebrile with blood pressures initially as low as 84/53 with improvement with IV fluids, and all other vital signs maintained.  Labs significant for WBC 18.4-> 13.1, hemoglobin 12.6-> 10.6, BUN 15, creatinine 1.6, glucose 180, high-sensitivity troponins negative x 2, and lactic 2.9.  Chest x-ray noted low lung volumes without acute disease process.  CT angiogram of the chest abdomen pelvis was obtained which noted at stranding and hyperdense fluid behind the left hemiabdomen posterior to the stomach and  extending along the left hemiabdomen within the left iliac fossa.  Surgery was consulted, but recommended IR evaluation and felt stable for hospitalist admission.  Pain medications of and now had been given and thought to be at least in part contributed to low blood pressures.  Patient was typed and screened, bolused 2 L of lactated Ringer's emergently that was not charted after initially having 2 syncopal episodes while in the triage, and subsequently ordered 2 additional liters of lactated Ringer's.  Review of Systems: As mentioned in the history of present illness. All other systems reviewed and are negative. Past Medical History:  Diagnosis Date   GERD (gastroesophageal reflux disease)    Migraine    Past Surgical History:  Procedure Laterality Date   COLONOSCOPY  05/2016   serrated adenoma and HP, diverticulosis, rpt 5 yrs Henrene Pastor)   ESOPHAGOGASTRODUODENOSCOPY  05/2016   GERD with esophagitis   EXCISION MASS NECK Left 02/26/2017   Procedure: EXCISION OF SUBCUTANEOUS MASS NECK LEFT;  Surgeon: Clyde Canterbury, MD;  Location: [facility];  Service: ENT;  Laterality: Left;   INGUINAL HERNIA REPAIR Left 2011   [place] Right 04/2015   Social History:  reports that he has quit smoking. He quit smokeless tobacco use about 18 years ago.  His smokeless tobacco use included chew. He reports current alcohol use. He reports that he does not use drugs.  No Known Allergies  Family History  Problem Relation Age of Onset   Asthma Mother    Migraines Mother    Stroke Mother  Colon polyps Mother    CAD Neg Hx    Cancer Neg Hx    Diabetes Neg Hx    Hypertension Neg Hx     Prior to Admission medications   Medication Sig Start Date End Date Taking? Authorizing Provider  SUMAtriptan (IMITREX) 100 MG tablet Take 1 tablet (100 mg total) by mouth once for 1 dose. May repeat in 2 hours if headache persists or recurs. 09/25/18 06/23/22 Yes Ria Bush, MD  cyclobenzaprine  (FLEXERIL) 10 MG tablet Take 1 tablet (10 mg total) by mouth 2 (two) times daily as needed for muscle spasms (sedation precautions). 09/25/18   Ria Bush, MD  famotidine (PEPCID) 20 MG tablet Take 1 tablet (20 mg total) by mouth 2 (two) times daily as needed for heartburn or indigestion. Patient not taking: Reported on 06/23/2022 10/30/19   Ria Bush, MD  HYDROcodone-acetaminophen (NORCO/VICODIN) 5-325 MG tablet Take 1-2 tablets by mouth every 6 (six) hours as needed for moderate pain. Patient not taking: Reported on 06/23/2022 02/26/17   Clyde Canterbury, MD  omeprazole (PRILOSEC) 40 MG capsule Take 1 capsule (40 mg total) by mouth daily. Patient not taking: Reported on 06/23/2022 10/30/19   Ria Bush, MD    Physical Exam: Vitals:   06/23/22 0830 06/23/22 0845 06/23/22 0900 06/23/22 0915  BP: (!) 89/56 (!) 89/61 (!) 84/53 93/64  Pulse: 69 67 65 66  Resp: 19 16 (!) 22 (!) 21  Temp:      TempSrc:      SpO2: 100% 100% 91% 97%  Weight:       Constitutional: Middle-aged obese male who appears to be in significant distress Eyes: PERRL, lids and conjunctivae normal ENMT: Mucous membranes are dry.  Fair dentition Neck: normal, supple Respiratory: Decreased overall aeration with mild expiratory wheeze appreciated.  O2 saturations currently maintained on room air. Cardiovascular: Tachycardic without clear murmur appreciated.  Trace lower extremity edema. Abdomen: Distended male with tenderness to palpation with decreased bowel sounds. Musculoskeletal: no clubbing / cyanosis. No joint deformity upper and lower extremities. Good ROM, no contractures. Normal muscle tone.  Skin: no rashes, lesions, ulcers. No induration Neurologic: CN 2-12 grossly intact. Strength 5/5 in all 4.  Psychiatric: Normal judgment and insight. Alert and oriented x 3.  Anxious mood.   Data Reviewed:  EKG significant for sinus rhythm at 71 bpm with incomplete RBBB and LAFB.  Reviewed labs, imaging and  pertinent records as noted above  Assessment and Plan:  Hemoperitoneum  symptomatic anemia   Acute.  Patient presents with complaints of severe epigastric abdominal pain that radiates down the center of his abdomen with abdominal distention.  Several syncopal episodes initial CT angiogram of the chest abdomen pelvis give concern for hemoperitoneum of unclear source.  General surgery had been consulted and consulted IR, but felt patient stable.  Patient with prior history of hernia repair and colon polyps removed. -Admit to a progressive bed -N.p.o. -Add-on PT/INR and APTT -Serial monitoring of H&H and will transfuse blood products as needed -Check CT scan of the abdomen pelvis stat -Morphine IV as needed for pain -Appreciate surgery and IR consultative services,   will follow-up for any further  Syncope Patient reported of multiple syncopal episodes and on hospital.  Thought likely secondary to concern for bleeding with low blood pressures.  No reports of chest pain.  High-sensitivity troponins negative x 2.    -Continue to monitor telemetry  Transient hypotension Acute.  Blood pressure initially as low as 84/53 while in  the ED.  Patient had been initially bolused 2 L of IV fluids emergently and then have ordered an additional 2 L which are currently completing with improvement in blood pressures temporarily but appear to be trending down.  Drops in blood pressure had been thought to be related to fentanyl. -Goal MAP greater than 65 -IV fluids as needed to maintain MAP  Leukocytosis Acute.  Initially elevated at 18.4, but repeat check 13.1.  Suspect possibly dilutional as all 3 lines are decreased.  Blood cultures have been checked. -Follow-up blood culture -Check CRP -Placed on empiric antibiotics of Rocephin and metronidazole  Lactic acidosis Acute.  Lactic acid initially 2.9 with repeat check 3.1. -Continue to trend lactic acid level  Acute kidney injury Creatinine 1.6, but  baseline previously around 1-1.2. -Monitor intake and output -Check urinalysis -IV fluids as noted above -Recheck kidney function in a.m.  GERD Patient not on any medication for treatment at this time -Protonix IV for GI prophylaxis  Obesity Last available BMI of 30.83 kg/m  DVT prophylaxis: SCDs due to concern for active bleeding Advance Care Planning:   Code Status: Full Code    Consults: Surgery and IR  Family Communication: Family updated at bedside  Severity of Illness: The appropriate patient status for this patient is OBSERVATION. Observation status is judged to be reasonable and necessary in order to provide the required intensity of service to ensure the patient's safety. The patient's presenting symptoms, physical exam findings, and initial radiographic and laboratory data in the context of their medical condition is felt to place them at decreased risk for further clinical deterioration. Furthermore, it is anticipated that the patient will be medically stable for discharge from the hospital within 2 midnights of admission.   Author: Norval Morton, MD 06/23/2022 10:04 AM  For on call review [email or web address].

## 2022-06-23 NOTE — ED Notes (Signed)
Patient stating he is having increased pain and SOB. Dr Tamala Julian notified of patient condition. Dr Tamala Julian now at bedside

## 2022-06-24 ENCOUNTER — Observation Stay (HOSPITAL_COMMUNITY): Payer: Managed Care, Other (non HMO)

## 2022-06-24 DIAGNOSIS — Z823 Family history of stroke: Secondary | ICD-10-CM | POA: Diagnosis not present

## 2022-06-24 DIAGNOSIS — E669 Obesity, unspecified: Secondary | ICD-10-CM | POA: Diagnosis present

## 2022-06-24 DIAGNOSIS — Z720 Tobacco use: Secondary | ICD-10-CM | POA: Diagnosis not present

## 2022-06-24 DIAGNOSIS — E872 Acidosis, unspecified: Secondary | ICD-10-CM | POA: Diagnosis present

## 2022-06-24 DIAGNOSIS — Z825 Family history of asthma and other chronic lower respiratory diseases: Secondary | ICD-10-CM | POA: Diagnosis not present

## 2022-06-24 DIAGNOSIS — S36899A Unspecified injury of other intra-abdominal organs, initial encounter: Secondary | ICD-10-CM | POA: Diagnosis present

## 2022-06-24 DIAGNOSIS — Z83719 Family history of colon polyps, unspecified: Secondary | ICD-10-CM | POA: Diagnosis not present

## 2022-06-24 DIAGNOSIS — I4510 Unspecified right bundle-branch block: Secondary | ICD-10-CM | POA: Diagnosis present

## 2022-06-24 DIAGNOSIS — I959 Hypotension, unspecified: Secondary | ICD-10-CM | POA: Diagnosis present

## 2022-06-24 DIAGNOSIS — Z23 Encounter for immunization: Secondary | ICD-10-CM | POA: Diagnosis not present

## 2022-06-24 DIAGNOSIS — R911 Solitary pulmonary nodule: Secondary | ICD-10-CM | POA: Diagnosis present

## 2022-06-24 DIAGNOSIS — M62838 Other muscle spasm: Secondary | ICD-10-CM | POA: Diagnosis present

## 2022-06-24 DIAGNOSIS — D72829 Elevated white blood cell count, unspecified: Secondary | ICD-10-CM | POA: Diagnosis present

## 2022-06-24 DIAGNOSIS — K760 Fatty (change of) liver, not elsewhere classified: Secondary | ICD-10-CM | POA: Diagnosis present

## 2022-06-24 DIAGNOSIS — R55 Syncope and collapse: Secondary | ICD-10-CM | POA: Diagnosis present

## 2022-06-24 DIAGNOSIS — G43909 Migraine, unspecified, not intractable, without status migrainosus: Secondary | ICD-10-CM | POA: Diagnosis present

## 2022-06-24 DIAGNOSIS — Z79899 Other long term (current) drug therapy: Secondary | ICD-10-CM | POA: Diagnosis not present

## 2022-06-24 DIAGNOSIS — I10 Essential (primary) hypertension: Secondary | ICD-10-CM | POA: Diagnosis present

## 2022-06-24 DIAGNOSIS — K2100 Gastro-esophageal reflux disease with esophagitis, without bleeding: Secondary | ICD-10-CM | POA: Diagnosis present

## 2022-06-24 DIAGNOSIS — F1011 Alcohol abuse, in remission: Secondary | ICD-10-CM | POA: Diagnosis present

## 2022-06-24 DIAGNOSIS — Z6830 Body mass index (BMI) 30.0-30.9, adult: Secondary | ICD-10-CM | POA: Diagnosis not present

## 2022-06-24 DIAGNOSIS — N179 Acute kidney failure, unspecified: Secondary | ICD-10-CM | POA: Diagnosis present

## 2022-06-24 DIAGNOSIS — K661 Hemoperitoneum: Secondary | ICD-10-CM | POA: Diagnosis present

## 2022-06-24 DIAGNOSIS — D62 Acute posthemorrhagic anemia: Secondary | ICD-10-CM | POA: Diagnosis present

## 2022-06-24 DIAGNOSIS — X500XXA Overexertion from strenuous movement or load, initial encounter: Secondary | ICD-10-CM | POA: Diagnosis not present

## 2022-06-24 LAB — BASIC METABOLIC PANEL
Anion gap: 11 (ref 5–15)
BUN: 25 mg/dL — ABNORMAL HIGH (ref 6–20)
CO2: 22 mmol/L (ref 22–32)
Calcium: 8.5 mg/dL — ABNORMAL LOW (ref 8.9–10.3)
Chloride: 104 mmol/L (ref 98–111)
Creatinine, Ser: 1.58 mg/dL — ABNORMAL HIGH (ref 0.61–1.24)
GFR, Estimated: 52 mL/min — ABNORMAL LOW (ref >60)
Glucose, Bld: 151 mg/dL — ABNORMAL HIGH (ref 70–99)
Potassium: 4.4 mmol/L (ref 3.5–5.1)
Sodium: 137 mmol/L (ref 135–145)

## 2022-06-24 LAB — SODIUM, URINE, RANDOM: Sodium, Ur: 39 mmol/L

## 2022-06-24 LAB — CBC
HCT: 28 % — ABNORMAL LOW (ref 39.0–52.0)
HCT: 27.2 % — ABNORMAL LOW (ref 39.0–52.0)
HCT: 23.8 % — ABNORMAL LOW (ref 39.0–52.0)
Hemoglobin: 9.2 g/dL — ABNORMAL LOW (ref 13.0–17.0)
Hemoglobin: 9.2 g/dL — ABNORMAL LOW (ref 13.0–17.0)
Hemoglobin: 8 g/dL — ABNORMAL LOW (ref 13.0–17.0)
MCH: 29.5 pg (ref 26.0–34.0)
MCH: 30 pg (ref 26.0–34.0)
MCH: 29.9 pg (ref 26.0–34.0)
MCHC: 32.9 g/dL (ref 30.0–36.0)
MCHC: 33.8 g/dL (ref 30.0–36.0)
MCHC: 33.6 g/dL (ref 30.0–36.0)
MCV: 89.7 fL (ref 80.0–100.0)
MCV: 88.6 fL (ref 80.0–100.0)
MCV: 88.8 fL (ref 80.0–100.0)
Platelets: 274 10*3/uL (ref 150–400)
Platelets: 273 10*3/uL (ref 150–400)
Platelets: 224 10*3/uL (ref 150–400)
RBC: 3.12 MIL/uL — ABNORMAL LOW (ref 4.22–5.81)
RBC: 3.07 MIL/uL — ABNORMAL LOW (ref 4.22–5.81)
RBC: 2.68 MIL/uL — ABNORMAL LOW (ref 4.22–5.81)
RDW: 13.3 % (ref 11.5–15.5)
RDW: 13.3 % (ref 11.5–15.5)
RDW: 13.3 % (ref 11.5–15.5)
WBC: 13.2 10*3/uL — ABNORMAL HIGH (ref 4.0–10.5)
WBC: 12.9 10*3/uL — ABNORMAL HIGH (ref 4.0–10.5)
WBC: 11.1 10*3/uL — ABNORMAL HIGH (ref 4.0–10.5)
nRBC: 0 % (ref 0.0–0.2)
nRBC: 0 % (ref 0.0–0.2)
nRBC: 0.2 % (ref 0.0–0.2)

## 2022-06-24 LAB — APTT: aPTT: 24 seconds (ref 24–36)

## 2022-06-24 LAB — MAGNESIUM: Magnesium: 1.9 mg/dL (ref 1.7–2.4)

## 2022-06-24 LAB — PROTIME-INR
INR: 1 (ref 0.8–1.2)
Prothrombin Time: 13.3 seconds (ref 11.4–15.2)

## 2022-06-24 LAB — URIC ACID: Uric Acid, Serum: 8.3 mg/dL (ref 3.7–8.6)

## 2022-06-24 LAB — C-REACTIVE PROTEIN: CRP: 3.9 mg/dL — ABNORMAL HIGH (ref <1.0)

## 2022-06-24 LAB — BLOOD PRODUCT ORDER (VERBAL) VERIFICATION

## 2022-06-24 LAB — CREATININE, URINE, RANDOM: Creatinine, Urine: 192 mg/dL

## 2022-06-24 MED ORDER — LACTATED RINGERS IV SOLN: INTRAVENOUS | Status: AC

## 2022-06-24 MED ORDER — CHLORHEXIDINE GLUCONATE CLOTH 2 % EX PADS: 6.0000 | MEDICATED_PAD | Freq: Every day | CUTANEOUS | Status: DC

## 2022-06-24 MED ORDER — DM-GUAIFENESIN ER 30-600 MG PO TB12
1.0000 | ORAL_TABLET | Freq: Two times a day (BID) | ORAL | Status: DC
  Administered 2022-06-24 – 2022-06-26 (×5): 1 via ORAL
  Filled 2022-06-24 (×5): qty 1

## 2022-06-24 MED ORDER — LACTATED RINGERS IV SOLN: INTRAVENOUS | Status: DC

## 2022-06-24 MED ORDER — TRAMADOL HCL 50 MG PO TABS
50.0000 mg | ORAL_TABLET | Freq: Four times a day (QID) | ORAL | Status: DC | PRN
  Administered 2022-06-25: 50 mg via ORAL
  Filled 2022-06-24: qty 1

## 2022-06-24 NOTE — Progress Notes (Signed)
PROGRESS NOTE                                                                                                                                                                                                             Patient Demographics:    David Moss, is a 56 y.o. male, DOB - 1966/06/07, YPP:[id number]  Outpatient Primary MD for the patient is Ria Bush, MD    LOS - 0  Admit date - 06/23/2022    Chief Complaint  Patient presents with   Abdominal Pain   Loss of Consciousness       Brief Narrative (HPI from H&P)    56 y.o. male with medical history significant of migraine headaches and GERD who presented with complaints of abdominal pain.  Symptoms started around 2:30 AM this morning and woke him out of his sleep.  He reports having severe pain in the epigastric region with radiation down the center part of his abdomen.  Interestingly he lifted a 400 pound cement bench with the help of a friend a day before that, no other trauma no other preceding symptoms.  He came to the ER where he was diagnosed with hemoperitoneum of unclear etiology, acute blood loss related anemia, hypotension and AKI.  Hypoperfusion related lactic acidosis, no sepsis.  CCS was consulted and he was admitted to hospitalist service   Subjective:    David Moss today has, No headache, No chest pain, positive generalized abdominal pain - No Nausea, No new weakness tingling or numbness, no SOB   Assessment  & Plan :    Hemoperitoneum causing abdominal pain, acute blood loss related anemia.  No preceding symptoms except that he lifted a heavy cement bench weighing 400 pounds a day before, remote history of alcohol abuse has not had a drink in months except 1 bottle of beer last week, general surgery following, stable INR, not on any blood thinners.  No other history suggestive of infection or trauma to the abdomen, continue to monitor H&H,  agreeable for transfusion if needed.  Pain control.  Further management per CCS.  CCS and IR both consulted.  Hypoperfusion related lactic acidosis no sepsis.  2.  AKI.  Due to anemia and hypoperfusion, has been adequately hydrated yesterday, since n.p.o. gentle LR for 12 more hours today.  Monitoring bladder scans.  3.  Obesity.  BMI of 30.  Follow with PCP.  4.  Incidental finding of 6 mm subpleural nodule within the lateral left lung base - outpatient pulmonary follow-up within a month of discharge to be arranged by PCP.  5.  Fatty liver with remote history of alcohol abuse.  Has not had alcohol drinks on a regular basis for months, counseled to continue to stain, PCP to monitor fatty liver.  6.  Reactionary leukocytosis.  Likely due to #1 above, continue empiric antibiotics for now.  David Moss appears nontoxic, no sepsis.      Condition -  Guarded  Family Communication  :  Wife bedside 06/24/22  Code Status :  Full  Consults  :  CCS  PUD Prophylaxis : PPI   Procedures  :     CT - 1. Moderate volume hemoperitoneum hematoma is slightly increased in volume when compared with prior exam. Area of highest attenuation is located posterior to the stomach and extends into the left iliac fossa, reflecting possible source of hemorrhage as described on prior same day CTA. 2. Aortic Atherosclerosis.   CTA - 1. No evidence for thoracic or abdominal aortic aneurysm or dissection. 2. There is fat stranding and hyperdense fluid within the left hemiabdomen posterior to the stomach and extending along the left hemiabdomen into the left iliac fossa. Imaging findings are concerning for hemoperitoneum. Etiology is indeterminate. Differential considerations include non-traumatic rupture of the spleen or abdominal vasculature. There is a branch vessel off the left gastric artery which extends into this area with equivocal blush of contrast around the distal portion of this vessel which may reflect site of  hemorrhage. 3. Hepatic steatosis. 4. 6 mm subpleural nodule within the lateral left lung base. If the patient is at high risk for bronchogenic carcinoma, follow-up chest CT at 6-12 months is recommended. If the patient is at low risk for bronchogenic carcinoma, follow-up chest CT at 12 months is recommended.       Disposition Plan  :    Status is: Observation  DVT Prophylaxis  :    Place and maintain sequential compression device Start: 06/24/22 0551 SCDs Start: 06/23/22 1035    Lab Results  Component Value Date   PLT 273 06/24/2022    Diet :  Diet Order             Diet NPO time specified Except for: Sips with Meds  Diet effective now                    Inpatient Medications  Scheduled Meds:  influenza vac split quadrivalent PF  0.5 mL Intramuscular Tomorrow-1000   pantoprazole (PROTONIX) IV  40 mg Intravenous Q24H   pneumococcal 20-valent conjugate vaccine  0.5 mL Intramuscular Tomorrow-1000   sodium chloride flush  3 mL Intravenous Q12H   Continuous Infusions:  cefTRIAXone (ROCEPHIN)  IV Stopped (06/23/22 1415)   lactated ringers     metronidazole 500 mg (06/24/22 0045)   PRN Meds:.acetaminophen **OR** acetaminophen, albuterol, morphine injection, ondansetron **OR** ondansetron (ZOFRAN) IV, oxyCODONE-acetaminophen, traMADol    Objective:   Vitals:   06/24/22 0021 06/24/22 0444 06/24/22 0600 06/24/22 0755  BP: 116/81 (!) 134/94 118/80 136/84  Pulse: (!) 104 89 92 (!) 101  Resp: '20 20 20 '$ (!) 27  Temp: 98.3 F (36.8 C) 97.7 F (36.5 C)  97.7 F (36.5 C)  TempSrc: Oral Oral  Oral  SpO2: 94% 96% 93% 93%  Weight:      Height:  Wt Readings from Last 3 Encounters:  06/23/22 124.1 kg  10/30/19 108.9 kg  02/26/17 105.2 kg     Intake/Output Summary (Last 24 hours) at 06/24/2022 0841 Last data filed at 06/23/2022 1800 Gross per 24 hour  Intake 296.01 ml  Output 600 ml  Net -303.99 ml     Physical Exam  Awake Alert, No new F.N deficits,  Normal affect [place].AT,PERRAL Supple Neck, No JVD,   Symmetrical Chest wall movement, Good air movement bilaterally, CTAB RRR,No Gallops,Rubs or new Murmurs,  +ve B.Sounds, Abd is somewhat distended but per patient and wife this is his baseline, mild generalized tenderness,   No Cyanosis, Clubbing or edema        Data Review:    Recent Labs  Lab 06/23/22 0637 06/23/22 0856 06/23/22 1200 06/23/22 1758 06/24/22 0021 06/24/22 0652  WBC 18.4* 13.1* 10.6* 12.9* 13.2* 12.9*  HGB 12.6* 10.6* 10.2* 10.3* 9.2* 9.2*  HCT 38.3* 32.9* 31.1* 31.7* 28.0* 27.2*  PLT 316 270 247 279 274 273  MCV 90.1 91.6 90.4 90.3 89.7 88.6  MCH 29.6 29.5 29.7 29.3 29.5 30.0  MCHC 32.9 32.2 32.8 32.5 32.9 33.8  RDW 12.8 13.0 13.0 13.1 13.3 13.3  LYMPHSABS 2.3  --   --   --   --   --   MONOABS 1.1*  --   --   --   --   --   EOSABS 0.1  --   --   --   --   --   BASOSABS 0.1  --   --   --   --   --     Recent Labs  Lab 06/23/22 0637 06/23/22 0756 06/23/22 0923 06/23/22 1200 06/23/22 1500 06/23/22 1758 06/24/22 0021 06/24/22 0652  NA 137  --   --   --   --   --   --  137  K 3.6  --   --   --   --   --   --  4.4  CL 103  --   --   --   --   --   --  104  CO2 22  --   --   --   --   --   --  22  ANIONGAP 12  --   --   --   --   --   --  11  GLUCOSE 180*  --   --   --   --   --   --  151*  BUN 15  --   --   --   --   --   --  25*  CREATININE 1.60*  --   --   --   --   --   --  1.58*  AST 25  --   --   --   --   --   --   --   ALT 31  --   --   --   --   --   --   --   ALKPHOS 38  --   --   --   --   --   --   --   BILITOT 0.8  --   --   --   --   --   --   --   ALBUMIN 3.5  --   --   --   --   --   --   --   CRP  --   --   --   --   --  1.8*  --   --   LATICACIDVEN  --  2.9* 3.1* 4.0* 3.4*  --   --   --   INR  --   --   --   --   --  1.0 1.0  --   MG  --   --   --   --   --   --   --  1.9  CALCIUM 9.4  --   --   --   --   --   --  8.5*      Radiology Reports DG Chest Port 1 View  Result  Date: 06/24/2022 CLINICAL DATA:  Hemoperitoneum.  Shortness of breath. EXAM: PORTABLE CHEST 1 VIEW COMPARISON:  06/23/2022 FINDINGS: Heart size appears normal. Lung volumes are low and there is asymmetric elevation of the right hemidiaphragm. Increased bibasilar atelectasis compared with the previous study. No pleural fluid or airspace disease. Visualized osseous structures appear intact. IMPRESSION: Low lung volumes with increased bibasilar atelectasis. Electronically Signed   By: Kerby Moors M.D.   On: 06/24/2022 06:24   CT ABDOMEN PELVIS WO CONTRAST  Result Date: 06/23/2022 CLINICAL DATA:  Assess for retroperitoneal bleed EXAM: CT ABDOMEN AND PELVIS WITHOUT CONTRAST TECHNIQUE: Multidetector CT imaging of the abdomen and pelvis was performed following the standard protocol without IV contrast. RADIATION DOSE REDUCTION: This exam was performed according to the departmental dose-optimization program which includes automated exposure control, adjustment of the mA and/or kV according to patient size and/or use of iterative reconstruction technique. COMPARISON:  CT chest, abdomen and pelvis dated June 23, 2022 obtained at 6:50 a.m. FINDINGS: Lower chest: Bibasilar atelectasis Hepatobiliary: No focal liver abnormality is seen. Vicarious excretion of contrast into the gallbladder. No biliary ductal dilation. Pancreas: Unremarkable. No pancreatic ductal dilatation or surrounding inflammatory changes. Spleen: Normal in size without focal abnormality. Adrenals/Urinary Tract: Bilateral adrenal glands are unremarkable. No hydronephrosis or nephrolithiasis. Excretion of contrast into the renal collecting systems. Unchanged low-attenuation lesion of the left kidney which is likely a simple cysts, no specific follow-up imaging is recommended bladder is unremarkable. Stomach/Bowel: Stomach is within normal limits. Appendix appears normal. No evidence of bowel wall thickening, distention, or inflammatory changes.  Vascular/Lymphatic: Aortic atherosclerosis. No enlarged abdominal or pelvic lymph nodes. Reproductive: Prostate is unremarkable. Other: Moderate volume hemoperitoneum hematoma is slightly increased in volume when compared with prior exam. Area of highest attenuation is located posterior to the stomach and extends into the left iliac fossa. Musculoskeletal: No acute or significant osseous findings. IMPRESSION: 1. Moderate volume hemoperitoneum hematoma is slightly increased in volume when compared with prior exam. Area of highest attenuation is located posterior to the stomach and extends into the left iliac fossa, reflecting possible source of hemorrhage as described on prior same day CTA. 2. Aortic Atherosclerosis (ICD10-I70.0). Electronically Signed   By: Yetta Glassman M.D.   On: 06/23/2022 14:48   DG Chest Portable 1 View  Result Date: 06/23/2022 CLINICAL DATA:  Syncope EXAM: PORTABLE CHEST 1 VIEW COMPARISON:  Chest CT from earlier today FINDINGS: Low volume chest. There is no edema, consolidation, effusion, or pneumothorax. Low volume chest accentuated cardiac size and aortic tortuosity, CT was performed earlier the same day. IMPRESSION: Low volume chest.  No evidence of acute cardiopulmonary disease. Electronically Signed   By: Jorje Guild M.D.   On: 06/23/2022 08:12   CT Angio Chest/Abd/Pel for Dissection W and/or Wo Contrast  Result Date: 06/23/2022 CLINICAL DATA:  Concern for aortic aneurysm EXAM: CT ANGIOGRAPHY CHEST,  ABDOMEN AND PELVIS TECHNIQUE: Non-contrast CT of the chest was initially obtained. Multidetector CT imaging through the chest, abdomen and pelvis was performed using the standard protocol during bolus administration of intravenous contrast. Multiplanar reconstructed images and MIPs were obtained and reviewed to evaluate the vascular anatomy. RADIATION DOSE REDUCTION: This exam was performed according to the departmental dose-optimization program which includes automated  exposure control, adjustment of the mA and/or kV according to patient size and/or use of iterative reconstruction technique. CONTRAST:  167m OMNIPAQUE IOHEXOL 350 MG/ML SOLN COMPARISON:  None Available. FINDINGS: CTA CHEST FINDINGS Cardiovascular: Preferential opacification of the thoracic aorta. No evidence of thoracic aortic aneurysm or dissection. Mild aortic atherosclerotic calcifications. Normal heart size. No pericardial effusion. Mediastinum/Nodes: No enlarged mediastinal, hilar, or axillary lymph nodes. Thyroid gland, trachea, and esophagus demonstrate no significant findings. Lungs/Pleura: No pleural fluid or airspace disease. No atelectasis or signs of pneumothorax. Subpleural nodule within the lateral left lung base measures 6 mm, image 68/7. Musculoskeletal: No chest wall abnormality. No acute or significant osseous findings. Review of the MIP images confirms the above findings. CTA ABDOMEN AND PELVIS FINDINGS VASCULAR Aorta: Normal caliber aorta without aneurysm, dissection, vasculitis or significant stenosis. Celiac: Patent without evidence of aneurysm, dissection, vasculitis or significant stenosis. SMA: Patent without evidence of aneurysm, dissection, vasculitis or significant stenosis. Renals: Both renal arteries are patent without evidence of aneurysm, dissection, vasculitis, fibromuscular dysplasia or significant stenosis. IMA: Patent without evidence of aneurysm, dissection, vasculitis or significant stenosis. Inflow: Patent without evidence of aneurysm, dissection, vasculitis or significant stenosis. Veins: No obvious venous abnormality within the limitations of this arterial phase study. Review of the MIP images confirms the above findings. NON-VASCULAR Hepatobiliary: Hepatic steatosis. No focal liver abnormality. Gallbladder appears normal. Pancreas: Unremarkable. No pancreatic ductal dilatation or surrounding inflammatory changes. Spleen: Perisplenic fluid noted. Heterogeneous enhancement on  the spleen likely reflects arterial phase. No focal splenic abnormality identified. Adrenals/Urinary Tract: Normal appearance of the adrenal glands. Benign left kidney cyst identified. No follow-up imaging recommended. No signs of nephrolithiasis or hydronephrosis. Urinary bladder is unremarkable. Stomach/Bowel: Stomach appears normal. No pathologic dilatation of the large or small bowel loops. No bowel wall thickening, inflammation, or distension. Lymphatic: No signs of abdominopelvic adenopathy. Reproductive: Prostate is unremarkable. Other: There is low attenuation fluid within the right upper quadrant of the abdomen adjacent to the inferior margin of the right lobe of liver, image 95/6. Within the left hemiabdomen there is fat stranding and hyperdense fluid posterior to the stomach and extending along the along the left hemiabdomen into the left iliac fossa. Imaging findings are concerning for hemoperitoneum. Etiology is indeterminate. Differential considerations include non-traumatic rupture of the spleen or abdominal vasculature. There is a branch off the left gastric artery which extends into this area and there is a small, equivocal blush of contrast around the distal portion of this vessel which may reflect site of hemorrhage, image 72/100 and image 155/6. Musculoskeletal: No acute or significant osseous findings. Review of the MIP images confirms the above findings. IMPRESSION: 1. No evidence for thoracic or abdominal aortic aneurysm or dissection. 2. There is fat stranding and hyperdense fluid within the left hemiabdomen posterior to the stomach and extending along the left hemiabdomen into the left iliac fossa. Imaging findings are concerning for hemoperitoneum. Etiology is indeterminate. Differential considerations include non-traumatic rupture of the spleen or abdominal vasculature. There is a branch vessel off the left gastric artery which extends into this area with equivocal blush of contrast around  the distal  portion of this vessel which may reflect site of hemorrhage. 3. Hepatic steatosis. 4. 6 mm subpleural nodule within the lateral left lung base. If the patient is at high risk for bronchogenic carcinoma, follow-up chest CT at 6-12 months is recommended. If the patient is at low risk for bronchogenic carcinoma, follow-up chest CT at 12 months is recommended. This recommendation follows the consensus statement: Guidelines for Management of Small Pulmonary Nodules Detected on CT Scans: A Statement from the [place] as published in Radiology 2005;237:395-400. 5. Critical Value/emergent results were called by telephone at the time of interpretation on 06/23/2022 at 7:50 am to provider [facility] , who verbally acknowledged these results. Electronically Signed   By: Kerby Moors M.D.   On: 06/23/2022 07:51      Signature  -   Lala Lund M.D on 06/24/2022 at 8:41 AM   -  To page go to [email or web address]

## 2022-06-24 NOTE — Progress Notes (Signed)
Progress Note: General Surgery Service   Chief Complaint/Subjective: Pain about a 7, about the same.  Passing flatus, no bowel movement.  Objective: Vital signs in last 24 hours: Temp:  [97.6 F (36.4 C)-98.6 F (37 C)] 97.7 F (36.5 C) (12/24 0755) Pulse Rate:  [71-175] 101 (12/24 0755) Resp:  [12-27] 27 (12/24 0755) BP: (83-136)/(56-94) 136/84 (12/24 0755) SpO2:  [91 %-100 %] 93 % (12/24 0755) Weight:  [124.1 kg] 124.1 kg (12/23 1718) Last BM Date : 06/22/22  Intake/Output from previous day: 12/23 0701 - 12/24 0700 In: 296 [I.V.:97.9; IV Piggyback:198.1] Out: 600 [Urine:600] Intake/Output this shift: No intake/output data recorded.  GI: Abd distended, tender; no palpable hepatosplenomegaly   Lab Results: CBC  Recent Labs    06/24/22 0021 06/24/22 0652  WBC 13.2* 12.9*  HGB 9.2* 9.2*  HCT 28.0* 27.2*  PLT 274 273   BMET Recent Labs    06/23/22 0637 06/24/22 0652  NA 137 137  K 3.6 4.4  CL 103 104  CO2 22 22  GLUCOSE 180* 151*  BUN 15 25*  CREATININE 1.60* 1.58*  CALCIUM 9.4 8.5*   PT/INR Recent Labs    06/23/22 1758 06/24/22 0021  LABPROT 13.4 13.3  INR 1.0 1.0   ABG No results for input(s): "PHART", "HCO3" in the last 72 hours.  Invalid input(s): "PCO2", "PO2"  Anti-infectives: Anti-infectives (From admission, onward)    Start     Dose/Rate Route Frequency Ordered Stop   06/23/22 1245  cefTRIAXone (ROCEPHIN) 2 g in sodium chloride 0.9 % 100 mL IVPB        2 g 200 mL/hr over 30 Minutes Intravenous Every 24 hours 06/23/22 1239 06/30/22 1244   06/23/22 1245  metroNIDAZOLE (FLAGYL) IVPB 500 mg        500 mg 100 mL/hr over 60 Minutes Intravenous Every 12 hours 06/23/22 1239 06/30/22 1244       Medications: Scheduled Meds:  Chlorhexidine Gluconate Cloth  6 each Topical Q0600   influenza vac split quadrivalent PF  0.5 mL Intramuscular Tomorrow-1000   pantoprazole (PROTONIX) IV  40 mg Intravenous Q24H   pneumococcal 20-valent conjugate  vaccine  0.5 mL Intramuscular Tomorrow-1000   sodium chloride flush  3 mL Intravenous Q12H   Continuous Infusions:  cefTRIAXone (ROCEPHIN)  IV Stopped (06/23/22 1415)   lactated ringers     metronidazole 500 mg (06/24/22 0045)   PRN Meds:.acetaminophen **OR** acetaminophen, albuterol, morphine injection, ondansetron **OR** ondansetron (ZOFRAN) IV, oxyCODONE-acetaminophen, traMADol  Assessment/Plan: David Moss had a spontaneous intra-abdominal bleed after lifting a heavy bench.    Acute blood loss anemia - hgb stabilizing Regular diet Increase activity Okay to shower   Bleeding seems to have stopped on its own.  Will take a while for blood to clot, break down, reabsorb, and for pain to improve.  I would try diet and activity today, if hgb stable tomorrow morning could go home tomorrow.  Hopefully can avoid surgery.   LOS: 0 days    David Morn, MD  [facility] Use [email or web address] to contact on call provider  Daily Billing: [phone] - High MDM

## 2022-06-25 ENCOUNTER — Inpatient Hospital Stay (HOSPITAL_COMMUNITY): Payer: Managed Care, Other (non HMO)

## 2022-06-25 DIAGNOSIS — K661 Hemoperitoneum: Secondary | ICD-10-CM | POA: Diagnosis not present

## 2022-06-25 LAB — COMPREHENSIVE METABOLIC PANEL
ALT: 23 U/L (ref 0–44)
AST: 23 U/L (ref 15–41)
Albumin: 3.1 g/dL — ABNORMAL LOW (ref 3.5–5.0)
Alkaline Phosphatase: 30 U/L — ABNORMAL LOW (ref 38–126)
Anion gap: 8 (ref 5–15)
BUN: 16 mg/dL (ref 6–20)
CO2: 26 mmol/L (ref 22–32)
Calcium: 8.4 mg/dL — ABNORMAL LOW (ref 8.9–10.3)
Chloride: 103 mmol/L (ref 98–111)
Creatinine, Ser: 1.25 mg/dL — ABNORMAL HIGH (ref 0.61–1.24)
GFR, Estimated: >60 mL/min (ref >60)
Glucose, Bld: 125 mg/dL — ABNORMAL HIGH (ref 70–99)
Potassium: 4.1 mmol/L (ref 3.5–5.1)
Sodium: 137 mmol/L (ref 135–145)
Total Bilirubin: 1.1 mg/dL (ref 0.3–1.2)
Total Protein: 5.8 g/dL — ABNORMAL LOW (ref 6.5–8.1)

## 2022-06-25 LAB — CBC WITH DIFFERENTIAL/PLATELET
Abs Immature Granulocytes: 0.08 10*3/uL — ABNORMAL HIGH (ref 0.00–0.07)
Basophils Absolute: 0 10*3/uL (ref 0.0–0.1)
Basophils Relative: 0 %
Eosinophils Absolute: 0 10*3/uL (ref 0.0–0.5)
Eosinophils Relative: 0 %
HCT: 21.8 % — ABNORMAL LOW (ref 39.0–52.0)
Hemoglobin: 7.1 g/dL — ABNORMAL LOW (ref 13.0–17.0)
Immature Granulocytes: 1 %
Lymphocytes Relative: 13 %
Lymphs Abs: 1.2 10*3/uL (ref 0.7–4.0)
MCH: 29.7 pg (ref 26.0–34.0)
MCHC: 32.6 g/dL (ref 30.0–36.0)
MCV: 91.2 fL (ref 80.0–100.0)
Monocytes Absolute: 1 10*3/uL (ref 0.1–1.0)
Monocytes Relative: 10 %
Neutro Abs: 7.2 10*3/uL (ref 1.7–7.7)
Neutrophils Relative %: 76 %
Platelets: 181 10*3/uL (ref 150–400)
RBC: 2.39 MIL/uL — ABNORMAL LOW (ref 4.22–5.81)
RDW: 13.3 % (ref 11.5–15.5)
WBC: 9.5 10*3/uL (ref 4.0–10.5)
nRBC: 0.3 % — ABNORMAL HIGH (ref 0.0–0.2)

## 2022-06-25 LAB — CBC
HCT: 23.4 % — ABNORMAL LOW (ref 39.0–52.0)
Hemoglobin: 8.2 g/dL — ABNORMAL LOW (ref 13.0–17.0)
MCH: 30.6 pg (ref 26.0–34.0)
MCHC: 35 g/dL (ref 30.0–36.0)
MCV: 87.3 fL (ref 80.0–100.0)
Platelets: 151 10*3/uL (ref 150–400)
RBC: 2.68 MIL/uL — ABNORMAL LOW (ref 4.22–5.81)
RDW: 13.7 % (ref 11.5–15.5)
WBC: 7.7 10*3/uL (ref 4.0–10.5)
nRBC: 0.8 % — ABNORMAL HIGH (ref 0.0–0.2)

## 2022-06-25 LAB — OSMOLALITY: Osmolality: 297 mOsm/kg — ABNORMAL HIGH (ref 275–295)

## 2022-06-25 LAB — OSMOLALITY, URINE: Osmolality, Ur: 969 mOsm/kg — ABNORMAL HIGH (ref 300–900)

## 2022-06-25 LAB — BRAIN NATRIURETIC PEPTIDE: B Natriuretic Peptide: 20.7 pg/mL (ref 0.0–100.0)

## 2022-06-25 LAB — MAGNESIUM: Magnesium: 1.8 mg/dL (ref 1.7–2.4)

## 2022-06-25 LAB — PREPARE RBC (CROSSMATCH)

## 2022-06-25 LAB — C-REACTIVE PROTEIN: CRP: 8.3 mg/dL — ABNORMAL HIGH (ref <1.0)

## 2022-06-25 MED ORDER — SODIUM CHLORIDE 0.9% IV SOLUTION: Freq: Once | INTRAVENOUS | Status: AC

## 2022-06-25 MED ORDER — DOCUSATE SODIUM 100 MG PO CAPS
200.0000 mg | ORAL_CAPSULE | Freq: Two times a day (BID) | ORAL | Status: DC
  Administered 2022-06-25 – 2022-06-26 (×2): 200 mg via ORAL
  Filled 2022-06-25 (×2): qty 2

## 2022-06-25 MED ORDER — FUROSEMIDE 10 MG/ML IJ SOLN
20.0000 mg | Freq: Once | INTRAMUSCULAR | Status: AC
  Administered 2022-06-25: 20 mg via INTRAVENOUS
  Filled 2022-06-25: qty 2

## 2022-06-25 MED ORDER — SODIUM CHLORIDE 0.9% IV SOLUTION: Freq: Once | INTRAVENOUS | Status: DC

## 2022-06-25 MED ORDER — PANTOPRAZOLE SODIUM 40 MG PO TBEC
40.0000 mg | DELAYED_RELEASE_TABLET | Freq: Every day | ORAL | Status: DC
  Administered 2022-06-25: 40 mg via ORAL

## 2022-06-25 NOTE — Progress Notes (Addendum)
PROGRESS NOTE                                                                                                                                                                                                             Patient Demographics:    David Moss, is a 56 y.o. male, DOB - 1966/02/04, KYH:[id number]  Outpatient Primary MD for the patient is Ria Bush, MD    LOS - 1  Admit date - 06/23/2022    Chief Complaint  Patient presents with   Abdominal Pain   Loss of Consciousness       Brief Narrative (HPI from H&P)    56 y.o. male with medical history significant of migraine headaches and GERD who presented with complaints of abdominal pain.  Symptoms started around 2:30 AM this morning and woke him out of his sleep.  He reports having severe pain in the epigastric region with radiation down the center part of his abdomen.  Interestingly he lifted a 400 pound cement bench with the help of a friend a day before that, no other trauma no other preceding symptoms.  He came to the ER where he was diagnosed with hemoperitoneum of unclear etiology, acute blood loss related anemia, hypotension and AKI.  Hypoperfusion related lactic acidosis, no sepsis.  CCS was consulted and he was admitted to hospitalist service   Subjective:   Patient in bed denies any headache chest pain, no shortness of breath, abdominal pain has improved.  No focal weakness.   Assessment  & Plan :    Hemoperitoneum causing abdominal pain, acute blood loss related anemia.  No preceding symptoms except that he lifted a heavy cement bench weighing 400 pounds a day before, remote history of alcohol abuse has not had a drink in months except 1 bottle of beer last week, general surgery following, stable INR, not on any blood thinners.  No other history suggestive of infection or trauma to the abdomen, hemoglobin has dropped a little on 06/25/2022 likely due to  old bleed and heme dilution from IV fluids, repeat CT scan stable on 06/25/2022, getting 2 units of packed RBC this morning, monitor CBC.  Overall improving.  Defer management to general surgery.  2.  AKI.  Due to anemia and hypoperfusion, has been adequately hydrated getting 2 units of packed RBCs.  Renal function  improving.  3.  Obesity.  BMI of 30.  Follow with PCP.  4.  Incidental finding of 6 mm subpleural nodule within the lateral left lung base - outpatient pulmonary follow-up within a month of discharge to be arranged by PCP.  5.  Fatty liver with remote history of alcohol abuse.  Has not had alcohol drinks on a regular basis for months, counseled to continue to stain, PCP to monitor fatty liver.  6.  Reactionary leukocytosis.  Likely due to #1 above, continue empiric antibiotics for now.  Clinically appears nontoxic, no sepsis.      Condition -  Guarded  Family Communication  :  Wife bedside 06/24/22  Code Status :  Full  Consults  :  CCS  PUD Prophylaxis : PPI   Procedures  :     Repeat CT scan noncontrast 06/25/2022.   1. Similar amount of hemoperitoneum or slightly decreased over the dome of the liver since the prior CT. Continued follow-up as clinically indicated. 2. No bowel obstruction. 3. Fatty liver. 4.  Aortic Atherosclerosis   CT - 1. Moderate volume hemoperitoneum hematoma is slightly increased in volume when compared with prior exam. Area of highest attenuation is located posterior to the stomach and extends into the left iliac fossa, reflecting possible source of hemorrhage as described on prior same day CTA. 2. Aortic Atherosclerosis.   CTA - 1. No evidence for thoracic or abdominal aortic aneurysm or dissection. 2. There is fat stranding and hyperdense fluid within the left hemiabdomen posterior to the stomach and extending along the left hemiabdomen into the left iliac fossa. Imaging findings are concerning for hemoperitoneum. Etiology is indeterminate.  Differential considerations include non-traumatic rupture of the spleen or abdominal vasculature. There is a branch vessel off the left gastric artery which extends into this area with equivocal blush of contrast around the distal portion of this vessel which may reflect site of hemorrhage. 3. Hepatic steatosis. 4. 6 mm subpleural nodule within the lateral left lung base. If the patient is at high risk for bronchogenic carcinoma, follow-up chest CT at 6-12 months is recommended. If the patient is at low risk for bronchogenic carcinoma, follow-up chest CT at 12 months is recommended.       Disposition Plan  :    Status is: Observation  DVT Prophylaxis  :    Place and maintain sequential compression device Start: 06/24/22 0551 SCDs Start: 06/23/22 1035    Lab Results  Component Value Date   PLT 181 06/25/2022    Diet :  Diet Order             Diet regular Room service appropriate? Yes; Fluid consistency: Thin  Diet effective now                    Inpatient Medications  Scheduled Meds:  sodium chloride   Intravenous Once   dextromethorphan-guaiFENesin  1 tablet Oral BID   furosemide  20 mg Intravenous Once   influenza vac split quadrivalent PF  0.5 mL Intramuscular Tomorrow-1000   pantoprazole (PROTONIX) IV  40 mg Intravenous Q24H   pneumococcal 20-valent conjugate vaccine  0.5 mL Intramuscular Tomorrow-1000   sodium chloride flush  3 mL Intravenous Q12H   Continuous Infusions:  cefTRIAXone (ROCEPHIN)  IV 2 g (06/24/22 1322)   metronidazole Stopped (06/25/22 0108)   PRN Meds:.acetaminophen **OR** acetaminophen, albuterol, morphine injection, ondansetron **OR** ondansetron (ZOFRAN) IV, oxyCODONE-acetaminophen, traMADol    Objective:   Vitals:   06/25/22 0432  06/25/22 0715 06/25/22 0741 06/25/22 0805  BP: 122/80 (!) 141/87 139/73 133/83  Pulse: 83 83 81 81  Resp: '18 15 16 16  '$ Temp: 98.2 F (36.8 C) 98.1 F (36.7 C) 98.2 F (36.8 C) 97.8 F (36.6 C)  TempSrc:  Oral Oral Oral   SpO2: 93% 93%    Weight:      Height:        Wt Readings from Last 3 Encounters:  06/23/22 124.1 kg  10/30/19 108.9 kg  02/26/17 105.2 kg     Intake/Output Summary (Last 24 hours) at 06/25/2022 0830 Last data filed at 06/25/2022 0810 Gross per 24 hour  Intake 712 ml  Output 1175 ml  Net -463 ml     Physical Exam  Awake Alert, No new F.N deficits, Normal affect [place].AT,PERRAL Supple Neck, No JVD,   Symmetrical Chest wall movement, Good air movement bilaterally, CTAB RRR,No Gallops, Rubs or new Murmurs,  +ve B.Sounds, Abd Soft and less distended, mild generalized tenderness. No Cyanosis, Clubbing or edema         Data Review:    Recent Labs  Lab 06/23/22 0637 06/23/22 0856 06/23/22 1758 06/24/22 0021 06/24/22 0652 06/24/22 1604 06/25/22 0030  WBC 18.4*   < > 12.9* 13.2* 12.9* 11.1* 9.5  HGB 12.6*   < > 10.3* 9.2* 9.2* 8.0* 7.1*  HCT 38.3*   < > 31.7* 28.0* 27.2* 23.8* 21.8*  PLT 316   < > 279 274 273 224 181  MCV 90.1   < > 90.3 89.7 88.6 88.8 91.2  MCH 29.6   < > 29.3 29.5 30.0 29.9 29.7  MCHC 32.9   < > 32.5 32.9 33.8 33.6 32.6  RDW 12.8   < > 13.1 13.3 13.3 13.3 13.3  LYMPHSABS 2.3  --   --   --   --   --  1.2  MONOABS 1.1*  --   --   --   --   --  1.0  EOSABS 0.1  --   --   --   --   --  0.0  BASOSABS 0.1  --   --   --   --   --  0.0   < > = values in this interval not displayed.    Recent Labs  Lab 06/23/22 0637 06/23/22 0756 06/23/22 0923 06/23/22 1200 06/23/22 1500 06/23/22 1758 06/24/22 0021 06/24/22 0652 06/25/22 0030  NA 137  --   --   --   --   --   --  137 137  K 3.6  --   --   --   --   --   --  4.4 4.1  CL 103  --   --   --   --   --   --  104 103  CO2 22  --   --   --   --   --   --  22 26  ANIONGAP 12  --   --   --   --   --   --  11 8  GLUCOSE 180*  --   --   --   --   --   --  151* 125*  BUN 15  --   --   --   --   --   --  25* 16  CREATININE 1.60*  --   --   --   --   --   --  1.58* 1.25*  AST 25  --   --    --   --   --   --   --  23  ALT 31  --   --   --   --   --   --   --  23  ALKPHOS 38  --   --   --   --   --   --   --  30*  BILITOT 0.8  --   --   --   --   --   --   --  1.1  ALBUMIN 3.5  --   --   --   --   --   --   --  3.1*  CRP  --   --   --   --   --  1.8*  --  3.9* 8.3*  LATICACIDVEN  --  2.9* 3.1* 4.0* 3.4*  --   --   --   --   INR  --   --   --   --   --  1.0 1.0  --   --   BNP  --   --   --   --   --   --   --   --  20.7  MG  --   --   --   --   --   --   --  1.9 1.8  CALCIUM 9.4  --   --   --   --   --   --  8.5* 8.4*      Radiology Reports CT ABDOMEN PELVIS WO CONTRAST  Result Date: 06/25/2022 CLINICAL DATA:  Abdominal pain. EXAM: CT ABDOMEN AND PELVIS WITHOUT CONTRAST TECHNIQUE: Multidetector CT imaging of the abdomen and pelvis was performed following the standard protocol without IV contrast. RADIATION DOSE REDUCTION: This exam was performed according to the departmental dose-optimization program which includes automated exposure control, adjustment of the mA and/or kV according to patient size and/or use of iterative reconstruction technique. COMPARISON:  CT abdomen pelvis dated 06/23/2022. FINDINGS: Evaluation of this exam is limited in the absence of intravenous contrast. Lower chest: Bibasilar subpleural atelectasis. No intra-abdominal free air. There is moderate amount of hemoperitoneum. The pneumoperitoneum over the liver dome has slightly decreased in size since the prior CT. However, the higher attenuating blood located in the posterior stomach and extending inferiorly into the left iliac fossa is essentially unchanged since the earlier CT. Hepatobiliary: Fatty liver. No biliary dilatation. High attenuating content within the gallbladder most consistent with a previous excretion of contrast. Pancreas: Unremarkable. No pancreatic ductal dilatation or surrounding inflammatory changes. Spleen: Normal in size without focal abnormality. Adrenals/Urinary Tract: The adrenal glands  are unremarkable. There is no hydronephrosis or nephrolithiasis on either side. Ill-defined hypodense lesion in the interpolar left kidney is not characterized on this noncontrast CT and was better evaluated on the prior CT. The visualized ureters appear unremarkable. The urinary bladder is minimally distended and grossly unremarkable. Stomach/Bowel: There is no bowel obstruction. Multiple descending colon diverticula. The appendix is not identified with certainty. Vascular/Lymphatic: Mild aortoiliac atherosclerotic disease. The IVC is grossly unremarkable. No portal venous gas. There is no adenopathy. Reproductive: The prostate and seminal vesicles are grossly unremarkable. No pelvic mass. Other: None Musculoskeletal: No acute or significant osseous findings. IMPRESSION: 1. Similar amount of hemoperitoneum or slightly decreased over the dome of the liver since the prior CT. Continued follow-up as clinically indicated. 2. No bowel obstruction. 3.  Fatty liver. 4.  Aortic Atherosclerosis (ICD10-I70.0). Electronically Signed   By: Anner Crete M.D.   On: 06/25/2022 03:49   DG Abd 1 View  Result Date: 06/25/2022 CLINICAL DATA:  Abdominal pain. EXAM: ABDOMEN - 1 VIEW COMPARISON:  Abdominal radiograph dated 12/15/2009. FINDINGS: Evaluation is limited due to body habitus. No bowel dilatation or evidence of obstruction. Is noted in the colon. The osseous structures are intact. The soft tissues are grossly unremarkable. IMPRESSION: Nonobstructive bowel gas pattern. Electronically Signed   By: Anner Crete M.D.   On: 06/25/2022 02:28   DG Chest Port 1 View  Result Date: 06/24/2022 CLINICAL DATA:  Hemoperitoneum.  Shortness of breath. EXAM: PORTABLE CHEST 1 VIEW COMPARISON:  06/23/2022 FINDINGS: Heart size appears normal. Lung volumes are low and there is asymmetric elevation of the right hemidiaphragm. Increased bibasilar atelectasis compared with the previous study. No pleural fluid or airspace disease.  Visualized osseous structures appear intact. IMPRESSION: Low lung volumes with increased bibasilar atelectasis. Electronically Signed   By: Kerby Moors M.D.   On: 06/24/2022 06:24   CT ABDOMEN PELVIS WO CONTRAST  Result Date: 06/23/2022 CLINICAL DATA:  Assess for retroperitoneal bleed EXAM: CT ABDOMEN AND PELVIS WITHOUT CONTRAST TECHNIQUE: Multidetector CT imaging of the abdomen and pelvis was performed following the standard protocol without IV contrast. RADIATION DOSE REDUCTION: This exam was performed according to the departmental dose-optimization program which includes automated exposure control, adjustment of the mA and/or kV according to patient size and/or use of iterative reconstruction technique. COMPARISON:  CT chest, abdomen and pelvis dated June 23, 2022 obtained at 6:50 a.m. FINDINGS: Lower chest: Bibasilar atelectasis Hepatobiliary: No focal liver abnormality is seen. Vicarious excretion of contrast into the gallbladder. No biliary ductal dilation. Pancreas: Unremarkable. No pancreatic ductal dilatation or surrounding inflammatory changes. Spleen: Normal in size without focal abnormality. Adrenals/Urinary Tract: Bilateral adrenal glands are unremarkable. No hydronephrosis or nephrolithiasis. Excretion of contrast into the renal collecting systems. Unchanged low-attenuation lesion of the left kidney which is likely a simple cysts, no specific follow-up imaging is recommended bladder is unremarkable. Stomach/Bowel: Stomach is within normal limits. Appendix appears normal. No evidence of bowel wall thickening, distention, or inflammatory changes. Vascular/Lymphatic: Aortic atherosclerosis. No enlarged abdominal or pelvic lymph nodes. Reproductive: Prostate is unremarkable. Other: Moderate volume hemoperitoneum hematoma is slightly increased in volume when compared with prior exam. Area of highest attenuation is located posterior to the stomach and extends into the left iliac fossa.  Musculoskeletal: No acute or significant osseous findings. IMPRESSION: 1. Moderate volume hemoperitoneum hematoma is slightly increased in volume when compared with prior exam. Area of highest attenuation is located posterior to the stomach and extends into the left iliac fossa, reflecting possible source of hemorrhage as described on prior same day CTA. 2. Aortic Atherosclerosis (ICD10-I70.0). Electronically Signed   By: Yetta Glassman M.D.   On: 06/23/2022 14:48   DG Chest Portable 1 View  Result Date: 06/23/2022 CLINICAL DATA:  Syncope EXAM: PORTABLE CHEST 1 VIEW COMPARISON:  Chest CT from earlier today FINDINGS: Low volume chest. There is no edema, consolidation, effusion, or pneumothorax. Low volume chest accentuated cardiac size and aortic tortuosity, CT was performed earlier the same day. IMPRESSION: Low volume chest.  No evidence of acute cardiopulmonary disease. Electronically Signed   By: Jorje Guild M.D.   On: 06/23/2022 08:12   CT Angio Chest/Abd/Pel for Dissection W and/or Wo Contrast  Result Date: 06/23/2022 CLINICAL DATA:  Concern for aortic aneurysm EXAM: CT ANGIOGRAPHY CHEST, ABDOMEN  AND PELVIS TECHNIQUE: Non-contrast CT of the chest was initially obtained. Multidetector CT imaging through the chest, abdomen and pelvis was performed using the standard protocol during bolus administration of intravenous contrast. Multiplanar reconstructed images and MIPs were obtained and reviewed to evaluate the vascular anatomy. RADIATION DOSE REDUCTION: This exam was performed according to the departmental dose-optimization program which includes automated exposure control, adjustment of the mA and/or kV according to patient size and/or use of iterative reconstruction technique. CONTRAST:  150m OMNIPAQUE IOHEXOL 350 MG/ML SOLN COMPARISON:  None Available. FINDINGS: CTA CHEST FINDINGS Cardiovascular: Preferential opacification of the thoracic aorta. No evidence of thoracic aortic aneurysm or  dissection. Mild aortic atherosclerotic calcifications. Normal heart size. No pericardial effusion. Mediastinum/Nodes: No enlarged mediastinal, hilar, or axillary lymph nodes. Thyroid gland, trachea, and esophagus demonstrate no significant findings. Lungs/Pleura: No pleural fluid or airspace disease. No atelectasis or signs of pneumothorax. Subpleural nodule within the lateral left lung base measures 6 mm, image 68/7. Musculoskeletal: No chest wall abnormality. No acute or significant osseous findings. Review of the MIP images confirms the above findings. CTA ABDOMEN AND PELVIS FINDINGS VASCULAR Aorta: Normal caliber aorta without aneurysm, dissection, vasculitis or significant stenosis. Celiac: Patent without evidence of aneurysm, dissection, vasculitis or significant stenosis. SMA: Patent without evidence of aneurysm, dissection, vasculitis or significant stenosis. Renals: Both renal arteries are patent without evidence of aneurysm, dissection, vasculitis, fibromuscular dysplasia or significant stenosis. IMA: Patent without evidence of aneurysm, dissection, vasculitis or significant stenosis. Inflow: Patent without evidence of aneurysm, dissection, vasculitis or significant stenosis. Veins: No obvious venous abnormality within the limitations of this arterial phase study. Review of the MIP images confirms the above findings. NON-VASCULAR Hepatobiliary: Hepatic steatosis. No focal liver abnormality. Gallbladder appears normal. Pancreas: Unremarkable. No pancreatic ductal dilatation or surrounding inflammatory changes. Spleen: Perisplenic fluid noted. Heterogeneous enhancement on the spleen likely reflects arterial phase. No focal splenic abnormality identified. Adrenals/Urinary Tract: Normal appearance of the adrenal glands. Benign left kidney cyst identified. No follow-up imaging recommended. No signs of nephrolithiasis or hydronephrosis. Urinary bladder is unremarkable. Stomach/Bowel: Stomach appears normal. No  pathologic dilatation of the large or small bowel loops. No bowel wall thickening, inflammation, or distension. Lymphatic: No signs of abdominopelvic adenopathy. Reproductive: Prostate is unremarkable. Other: There is low attenuation fluid within the right upper quadrant of the abdomen adjacent to the inferior margin of the right lobe of liver, image 95/6. Within the left hemiabdomen there is fat stranding and hyperdense fluid posterior to the stomach and extending along the along the left hemiabdomen into the left iliac fossa. Imaging findings are concerning for hemoperitoneum. Etiology is indeterminate. Differential considerations include non-traumatic rupture of the spleen or abdominal vasculature. There is a branch off the left gastric artery which extends into this area and there is a small, equivocal blush of contrast around the distal portion of this vessel which may reflect site of hemorrhage, image 72/100 and image 155/6. Musculoskeletal: No acute or significant osseous findings. Review of the MIP images confirms the above findings. IMPRESSION: 1. No evidence for thoracic or abdominal aortic aneurysm or dissection. 2. There is fat stranding and hyperdense fluid within the left hemiabdomen posterior to the stomach and extending along the left hemiabdomen into the left iliac fossa. Imaging findings are concerning for hemoperitoneum. Etiology is indeterminate. Differential considerations include non-traumatic rupture of the spleen or abdominal vasculature. There is a branch vessel off the left gastric artery which extends into this area with equivocal blush of contrast around the distal portion of  this vessel which may reflect site of hemorrhage. 3. Hepatic steatosis. 4. 6 mm subpleural nodule within the lateral left lung base. If the patient is at high risk for bronchogenic carcinoma, follow-up chest CT at 6-12 months is recommended. If the patient is at low risk for bronchogenic carcinoma, follow-up chest CT  at 12 months is recommended. This recommendation follows the consensus statement: Guidelines for Management of Small Pulmonary Nodules Detected on CT Scans: A Statement from the [employer] as published in Radiology 2005;237:395-400. 5. Critical Value/emergent results were called by telephone at the time of interpretation on 06/23/2022 at 7:50 am to provider [facility] , who verbally acknowledged these results. Electronically Signed   By: Kerby Moors M.D.   On: 06/23/2022 07:51      Signature  -   Lala Lund M.D on 06/25/2022 at 8:30 AM   -  To page go to [email or web address]

## 2022-06-25 NOTE — Progress Notes (Signed)
Patient left unit for stat CT of Abd and Pelvis.

## 2022-06-25 NOTE — Progress Notes (Signed)
Overnight event  Notified by RN that patient is complaining of abdominal pain and on exam his abdomen is firm with hypoactive bowel sounds.  His hemoglobin has been trending down but he remains hemodynamically stable.  Most recent vital signs: Temperature 98.7 F, heart rate 88, blood pressure 127/77, SpO2 93% on room air.  Chart reviewed.  Patient admitted 2 days ago for acute onset abdominal pain after lifting a 400 pound weight.  He was diagnosed with hemoperitoneum, acute blood loss anemia, and AKI.  General surgery and IR consulted. His hemoglobin has trended down from 12.6 at the time of admission > 9.2 yesterday > 7.1 tonight.    -I have called and notified general surgery (Dr. Thermon Leyland) -KUB done and negative.  Stat CT abdomen pelvis ordered. -Type and screen done, 2 unit PRBCs ordered after obtaining consent from the patient.  Follow-up posttransfusion H&H.

## 2022-06-25 NOTE — Progress Notes (Signed)
Patient ID: David Moss, male   DOB: [birth year], 56 y.o.   MRN: [id number]      Subjective: Walked yesterday, some abd soreness, ate breakfast ROS negative except as listed above. Objective: Vital signs in last 24 hours: Temp:  [97.8 F (36.6 C)-98.7 F (37.1 C)] 97.8 F (36.6 C) (12/25 0805) Pulse Rate:  [81-88] 81 (12/25 0805) Resp:  [15-20] 16 (12/25 0805) BP: (122-142)/(72-87) 133/83 (12/25 0805) SpO2:  [93 %] 93 % (12/25 0715) Last BM Date : 06/22/22  Intake/Output from previous day: 12/24 0701 - 12/25 0700 In: 200 [IV Piggyback:200] Out: 1175 [Urine:1175] Intake/Output this shift: Total I/O In: 512 [I.V.:100; Blood:312; IV Piggyback:100] Out: -   General appearance: alert and cooperative Resp: clear to auscultation bilaterally GI: soft, mild tenderness without peritonitis  Lab Results: CBC  Recent Labs    06/24/22 1604 06/25/22 0030  WBC 11.1* 9.5  HGB 8.0* 7.1*  HCT 23.8* 21.8*  PLT 224 181   BMET Recent Labs    06/24/22 0652 06/25/22 0030  NA 137 137  K 4.4 4.1  CL 104 103  CO2 22 26  GLUCOSE 151* 125*  BUN 25* 16  CREATININE 1.58* 1.25*  CALCIUM 8.5* 8.4*   PT/INR Recent Labs    06/23/22 1758 06/24/22 0021  LABPROT 13.4 13.3  INR 1.0 1.0   ABG No results for input(s): "PHART", "HCO3" in the last 72 hours.  Invalid input(s): "PCO2", "PO2"  Studies/Results: CT ABDOMEN PELVIS WO CONTRAST  Result Date: 06/25/2022 CLINICAL DATA:  Abdominal pain. EXAM: CT ABDOMEN AND PELVIS WITHOUT CONTRAST TECHNIQUE: Multidetector CT imaging of the abdomen and pelvis was performed following the standard protocol without IV contrast. RADIATION DOSE REDUCTION: This exam was performed according to the departmental dose-optimization program which includes automated exposure control, adjustment of the mA and/or kV according to patient size and/or use of iterative reconstruction technique. COMPARISON:  CT abdomen pelvis dated 06/23/2022. FINDINGS: Evaluation  of this exam is limited in the absence of intravenous contrast. Lower chest: Bibasilar subpleural atelectasis. No intra-abdominal free air. There is moderate amount of hemoperitoneum. The pneumoperitoneum over the liver dome has slightly decreased in size since the prior CT. However, the higher attenuating blood located in the posterior stomach and extending inferiorly into the left iliac fossa is essentially unchanged since the earlier CT. Hepatobiliary: Fatty liver. No biliary dilatation. High attenuating content within the gallbladder most consistent with a previous excretion of contrast. Pancreas: Unremarkable. No pancreatic ductal dilatation or surrounding inflammatory changes. Spleen: Normal in size without focal abnormality. Adrenals/Urinary Tract: The adrenal glands are unremarkable. There is no hydronephrosis or nephrolithiasis on either side. Ill-defined hypodense lesion in the interpolar left kidney is not characterized on this noncontrast CT and was better evaluated on the prior CT. The visualized ureters appear unremarkable. The urinary bladder is minimally distended and grossly unremarkable. Stomach/Bowel: There is no bowel obstruction. Multiple descending colon diverticula. The appendix is not identified with certainty. Vascular/Lymphatic: Mild aortoiliac atherosclerotic disease. The IVC is grossly unremarkable. No portal venous gas. There is no adenopathy. Reproductive: The prostate and seminal vesicles are grossly unremarkable. No pelvic mass. Other: None Musculoskeletal: No acute or significant osseous findings. IMPRESSION: 1. Similar amount of hemoperitoneum or slightly decreased over the dome of the liver since the prior CT. Continued follow-up as clinically indicated. 2. No bowel obstruction. 3. Fatty liver. 4.  Aortic Atherosclerosis (ICD10-I70.0). Electronically Signed   By: Anner Crete M.D.   On: 06/25/2022 03:49   DG Abd  1 View  Result Date: 06/25/2022 CLINICAL DATA:  Abdominal  pain. EXAM: ABDOMEN - 1 VIEW COMPARISON:  Abdominal radiograph dated 12/15/2009. FINDINGS: Evaluation is limited due to body habitus. No bowel dilatation or evidence of obstruction. Is noted in the colon. The osseous structures are intact. The soft tissues are grossly unremarkable. IMPRESSION: Nonobstructive bowel gas pattern. Electronically Signed   By: Anner Crete M.D.   On: 06/25/2022 02:28   DG Chest Port 1 View  Result Date: 06/24/2022 CLINICAL DATA:  Hemoperitoneum.  Shortness of breath. EXAM: PORTABLE CHEST 1 VIEW COMPARISON:  06/23/2022 FINDINGS: Heart size appears normal. Lung volumes are low and there is asymmetric elevation of the right hemidiaphragm. Increased bibasilar atelectasis compared with the previous study. No pleural fluid or airspace disease. Visualized osseous structures appear intact. IMPRESSION: Low lung volumes with increased bibasilar atelectasis. Electronically Signed   By: Kerby Moors M.D.   On: 06/24/2022 06:24   CT ABDOMEN PELVIS WO CONTRAST  Result Date: 06/23/2022 CLINICAL DATA:  Assess for retroperitoneal bleed EXAM: CT ABDOMEN AND PELVIS WITHOUT CONTRAST TECHNIQUE: Multidetector CT imaging of the abdomen and pelvis was performed following the standard protocol without IV contrast. RADIATION DOSE REDUCTION: This exam was performed according to the departmental dose-optimization program which includes automated exposure control, adjustment of the mA and/or kV according to patient size and/or use of iterative reconstruction technique. COMPARISON:  CT chest, abdomen and pelvis dated June 23, 2022 obtained at 6:50 a.m. FINDINGS: Lower chest: Bibasilar atelectasis Hepatobiliary: No focal liver abnormality is seen. Vicarious excretion of contrast into the gallbladder. No biliary ductal dilation. Pancreas: Unremarkable. No pancreatic ductal dilatation or surrounding inflammatory changes. Spleen: Normal in size without focal abnormality. Adrenals/Urinary Tract:  Bilateral adrenal glands are unremarkable. No hydronephrosis or nephrolithiasis. Excretion of contrast into the renal collecting systems. Unchanged low-attenuation lesion of the left kidney which is likely a simple cysts, no specific follow-up imaging is recommended bladder is unremarkable. Stomach/Bowel: Stomach is within normal limits. Appendix appears normal. No evidence of bowel wall thickening, distention, or inflammatory changes. Vascular/Lymphatic: Aortic atherosclerosis. No enlarged abdominal or pelvic lymph nodes. Reproductive: Prostate is unremarkable. Other: Moderate volume hemoperitoneum hematoma is slightly increased in volume when compared with prior exam. Area of highest attenuation is located posterior to the stomach and extends into the left iliac fossa. Musculoskeletal: No acute or significant osseous findings. IMPRESSION: 1. Moderate volume hemoperitoneum hematoma is slightly increased in volume when compared with prior exam. Area of highest attenuation is located posterior to the stomach and extends into the left iliac fossa, reflecting possible source of hemorrhage as described on prior same day CTA. 2. Aortic Atherosclerosis (ICD10-I70.0). Electronically Signed   By: Yetta Glassman M.D.   On: 06/23/2022 14:48    Anti-infectives: Anti-infectives (From admission, onward)    Start     Dose/Rate Route Frequency Ordered Stop   06/23/22 1245  cefTRIAXone (ROCEPHIN) 2 g in sodium chloride 0.9 % 100 mL IVPB        2 g 200 mL/hr over 30 Minutes Intravenous Every 24 hours 06/23/22 1239 06/30/22 1244   06/23/22 1245  metroNIDAZOLE (FLAGYL) IVPB 500 mg        500 mg 100 mL/hr over 60 Minutes Intravenous Every 12 hours 06/23/22 1239 06/30/22 1244       Assessment/Plan: Mr. Postiglione had a spontaneous intra-abdominal bleed after lifting a heavy bench.    Repeat CT A/P this AM looks slightly improved, no worsening hemorrhage Acute blood loss anemia - likely equilibrating  at this point, 1u  PRBC going now, F/U CBC Tolerating regular diet  No need for emergent surgery  I spoke with his wife as well along with his SIL on the phone  LOS: 1 day    Georganna Skeans, MD, MPH, FACS Trauma & General Surgery Use [email or web address] to contact on call provider  06/25/2022

## 2022-06-26 ENCOUNTER — Other Ambulatory Visit (HOSPITAL_COMMUNITY): Payer: Self-pay

## 2022-06-26 DIAGNOSIS — K661 Hemoperitoneum: Secondary | ICD-10-CM | POA: Diagnosis not present

## 2022-06-26 LAB — TYPE AND SCREEN
ABO/RH(D): A POS
Antibody Screen: NEGATIVE
Unit division: 0
Unit division: 0
Unit division: 0

## 2022-06-26 LAB — BPAM RBC
Blood Product Expiration Date: 202401072359
Blood Product Expiration Date: 202401122359
Blood Product Expiration Date: 202401162359
ISSUE DATE / TIME: 202312230639
ISSUE DATE / TIME: 202312250401
ISSUE DATE / TIME: 202312250746
Unit Type and Rh: 5100
Unit Type and Rh: 6200
Unit Type and Rh: 6200

## 2022-06-26 LAB — BRAIN NATRIURETIC PEPTIDE: B Natriuretic Peptide: 90.3 pg/mL (ref 0.0–100.0)

## 2022-06-26 LAB — COMPREHENSIVE METABOLIC PANEL
ALT: 20 U/L (ref 0–44)
AST: 19 U/L (ref 15–41)
Albumin: 3 g/dL — ABNORMAL LOW (ref 3.5–5.0)
Alkaline Phosphatase: 34 U/L — ABNORMAL LOW (ref 38–126)
Anion gap: 5 (ref 5–15)
BUN: 11 mg/dL (ref 6–20)
CO2: 30 mmol/L (ref 22–32)
Calcium: 8.2 mg/dL — ABNORMAL LOW (ref 8.9–10.3)
Chloride: 103 mmol/L (ref 98–111)
Creatinine, Ser: 1.12 mg/dL (ref 0.61–1.24)
GFR, Estimated: >60 mL/min (ref >60)
Glucose, Bld: 112 mg/dL — ABNORMAL HIGH (ref 70–99)
Potassium: 3.7 mmol/L (ref 3.5–5.1)
Sodium: 138 mmol/L (ref 135–145)
Total Bilirubin: 1.4 mg/dL — ABNORMAL HIGH (ref 0.3–1.2)
Total Protein: 5.6 g/dL — ABNORMAL LOW (ref 6.5–8.1)

## 2022-06-26 LAB — CBC WITH DIFFERENTIAL/PLATELET
Abs Immature Granulocytes: 0.09 10*3/uL — ABNORMAL HIGH (ref 0.00–0.07)
Basophils Absolute: 0 10*3/uL (ref 0.0–0.1)
Basophils Relative: 0 %
Eosinophils Absolute: 0.1 10*3/uL (ref 0.0–0.5)
Eosinophils Relative: 1 %
HCT: 24 % — ABNORMAL LOW (ref 39.0–52.0)
Hemoglobin: 8 g/dL — ABNORMAL LOW (ref 13.0–17.0)
Immature Granulocytes: 1 %
Lymphocytes Relative: 18 %
Lymphs Abs: 1.3 10*3/uL (ref 0.7–4.0)
MCH: 29.6 pg (ref 26.0–34.0)
MCHC: 33.3 g/dL (ref 30.0–36.0)
MCV: 88.9 fL (ref 80.0–100.0)
Monocytes Absolute: 0.6 10*3/uL (ref 0.1–1.0)
Monocytes Relative: 9 %
Neutro Abs: 4.9 10*3/uL (ref 1.7–7.7)
Neutrophils Relative %: 71 %
Platelets: 147 10*3/uL — ABNORMAL LOW (ref 150–400)
RBC: 2.7 MIL/uL — ABNORMAL LOW (ref 4.22–5.81)
RDW: 13.7 % (ref 11.5–15.5)
WBC: 7 10*3/uL (ref 4.0–10.5)
nRBC: 0.6 % — ABNORMAL HIGH (ref 0.0–0.2)

## 2022-06-26 LAB — CBC
HCT: 24.3 % — ABNORMAL LOW (ref 39.0–52.0)
Hemoglobin: 8.2 g/dL — ABNORMAL LOW (ref 13.0–17.0)
MCH: 29.9 pg (ref 26.0–34.0)
MCHC: 33.7 g/dL (ref 30.0–36.0)
MCV: 88.7 fL (ref 80.0–100.0)
Platelets: 147 10*3/uL — ABNORMAL LOW (ref 150–400)
RBC: 2.74 MIL/uL — ABNORMAL LOW (ref 4.22–5.81)
RDW: 13.6 % (ref 11.5–15.5)
WBC: 6.3 10*3/uL (ref 4.0–10.5)
nRBC: 0.8 % — ABNORMAL HIGH (ref 0.0–0.2)

## 2022-06-26 LAB — MAGNESIUM: Magnesium: 2.1 mg/dL (ref 1.7–2.4)

## 2022-06-26 LAB — C-REACTIVE PROTEIN: CRP: 10.8 mg/dL — ABNORMAL HIGH (ref <1.0)

## 2022-06-26 MED ORDER — PANTOPRAZOLE SODIUM 40 MG PO TBEC
40.0000 mg | DELAYED_RELEASE_TABLET | Freq: Every day | ORAL | 0 refills | Status: DC
  Filled 2022-06-26: qty 30, 30d supply, fill #0

## 2022-06-26 MED ORDER — FOLIC ACID 1 MG PO TABS
1.0000 mg | ORAL_TABLET | Freq: Every day | ORAL | 0 refills | Status: DC
  Filled 2022-06-26: qty 30, 30d supply, fill #0

## 2022-06-26 MED ORDER — FERROUS SULFATE 325 (65 FE) MG PO TABS
325.0000 mg | ORAL_TABLET | Freq: Two times a day (BID) | ORAL | 0 refills | Status: DC
  Filled 2022-06-26: qty 60, 30d supply, fill #0

## 2022-06-26 MED ORDER — CARVEDILOL 3.125 MG PO TABS
3.1250 mg | ORAL_TABLET | Freq: Two times a day (BID) | ORAL | 0 refills | Status: DC
  Filled 2022-06-26: qty 60, 30d supply, fill #0

## 2022-06-26 MED ORDER — ACETAMINOPHEN 325 MG PO TABS
650.0000 mg | ORAL_TABLET | Freq: Four times a day (QID) | ORAL | 0 refills | Status: DC | PRN
  Filled 2022-06-26: qty 20, 3d supply, fill #0

## 2022-06-26 MED ORDER — CARVEDILOL 3.125 MG PO TABS: 3.1250 mg | ORAL_TABLET | Freq: Two times a day (BID) | ORAL | Status: DC

## 2022-06-26 MED ORDER — DOCUSATE SODIUM 100 MG PO CAPS
200.0000 mg | ORAL_CAPSULE | Freq: Two times a day (BID) | ORAL | 0 refills | Status: DC | PRN
  Filled 2022-06-26: qty 20, 5d supply, fill #0

## 2022-06-26 NOTE — Plan of Care (Signed)

## 2022-06-26 NOTE — Progress Notes (Signed)
Progress Note     Subjective: Pt reports some mild abdominal soreness but pain significantly improved. Denies n/v. Tolerating diet and having bowel function. Family member at bedside.   Objective: Vital signs in last 24 hours: Temp:  [98 F (36.7 C)-98.7 F (37.1 C)] 98.2 F (36.8 C) (12/26 0810) Pulse Rate:  [77-82] 77 (12/25 2315) Resp:  [14-20] 19 (12/26 0810) BP: (137-164)/(80-88) 149/88 (12/26 0810) SpO2:  [95 %-99 %] 98 % (12/26 0810) Last BM Date : 06/22/22  Intake/Output from previous day: 12/25 0701 - 12/26 0700 In: 1184.2 [I.V.:182.2; Blood:602; IV Piggyback:400.1] Out: 3300 [Urine:3300] Intake/Output this shift: No intake/output data recorded.  PE: General: pleasant, WD, obese male who is laying in bed in NAD Heart: regular, rate, and rhythm.   Lungs: Respiratory effort nonlabored Abd: soft, NT, ND, +BS, no masses, hernias, or organomegaly   Lab Results:  Recent Labs    06/26/22 0036 06/26/22 0558  WBC 7.0 6.3  HGB 8.0* 8.2*  HCT 24.0* 24.3*  PLT 147* 147*   BMET Recent Labs    06/25/22 0030 06/26/22 0036  NA 137 138  K 4.1 3.7  CL 103 103  CO2 26 30  GLUCOSE 125* 112*  BUN 16 11  CREATININE 1.25* 1.12  CALCIUM 8.4* 8.2*   PT/INR Recent Labs    06/23/22 1758 06/24/22 0021  LABPROT 13.4 13.3  INR 1.0 1.0   CMP     Component Value Date/Time   NA 138 06/26/2022 0036   NA 140 05/30/2011 0000   K 3.7 06/26/2022 0036   K 4.4 05/30/2011 0000   CL 103 06/26/2022 0036   CO2 30 06/26/2022 0036   GLUCOSE 112 (H) 06/26/2022 0036   BUN 11 06/26/2022 0036   CREATININE 1.12 06/26/2022 0036   CREATININE 1.22 10/30/2019 1423   CALCIUM 8.2 (L) 06/26/2022 0036   PROT 5.6 (L) 06/26/2022 0036   ALBUMIN 3.0 (L) 06/26/2022 0036   AST 19 06/26/2022 0036   AST 19 05/30/2011 0000   ALT 20 06/26/2022 0036   ALT 25 05/30/2011 0000   ALKPHOS 34 (L) 06/26/2022 0036   ALKPHOS 51 05/30/2011 0000   BILITOT 1.4 (H) 06/26/2022 0036   GFRNONAA >60  06/26/2022 0036   GFRAA  12/21/2009 0556    >60        The eGFR has been calculated using the MDRD equation. This calculation has not been validated in all clinical situations. eGFR's persistently <60 mL/min signify possible Chronic Kidney Disease.   Lipase     Component Value Date/Time   LIPASE 37 06/23/2022 0637       Studies/Results: CT ABDOMEN PELVIS WO CONTRAST  Result Date: 06/25/2022 CLINICAL DATA:  Abdominal pain. EXAM: CT ABDOMEN AND PELVIS WITHOUT CONTRAST TECHNIQUE: Multidetector CT imaging of the abdomen and pelvis was performed following the standard protocol without IV contrast. RADIATION DOSE REDUCTION: This exam was performed according to the departmental dose-optimization program which includes automated exposure control, adjustment of the mA and/or kV according to patient size and/or use of iterative reconstruction technique. COMPARISON:  CT abdomen pelvis dated 06/23/2022. FINDINGS: Evaluation of this exam is limited in the absence of intravenous contrast. Lower chest: Bibasilar subpleural atelectasis. No intra-abdominal free air. There is moderate amount of hemoperitoneum. The pneumoperitoneum over the liver dome has slightly decreased in size since the prior CT. However, the higher attenuating blood located in the posterior stomach and extending inferiorly into the left iliac fossa is essentially unchanged since the earlier CT.  Hepatobiliary: Fatty liver. No biliary dilatation. High attenuating content within the gallbladder most consistent with a previous excretion of contrast. Pancreas: Unremarkable. No pancreatic ductal dilatation or surrounding inflammatory changes. Spleen: Normal in size without focal abnormality. Adrenals/Urinary Tract: The adrenal glands are unremarkable. There is no hydronephrosis or nephrolithiasis on either side. Ill-defined hypodense lesion in the interpolar left kidney is not characterized on this noncontrast CT and was better evaluated on  the prior CT. The visualized ureters appear unremarkable. The urinary bladder is minimally distended and grossly unremarkable. Stomach/Bowel: There is no bowel obstruction. Multiple descending colon diverticula. The appendix is not identified with certainty. Vascular/Lymphatic: Mild aortoiliac atherosclerotic disease. The IVC is grossly unremarkable. No portal venous gas. There is no adenopathy. Reproductive: The prostate and seminal vesicles are grossly unremarkable. No pelvic mass. Other: None Musculoskeletal: No acute or significant osseous findings. IMPRESSION: 1. Similar amount of hemoperitoneum or slightly decreased over the dome of the liver since the prior CT. Continued follow-up as clinically indicated. 2. No bowel obstruction. 3. Fatty liver. 4.  Aortic Atherosclerosis (ICD10-I70.0). Electronically Signed   By: Anner Crete M.D.   On: 06/25/2022 03:49   DG Abd 1 View  Result Date: 06/25/2022 CLINICAL DATA:  Abdominal pain. EXAM: ABDOMEN - 1 VIEW COMPARISON:  Abdominal radiograph dated 12/15/2009. FINDINGS: Evaluation is limited due to body habitus. No bowel dilatation or evidence of obstruction. Is noted in the colon. The osseous structures are intact. The soft tissues are grossly unremarkable. IMPRESSION: Nonobstructive bowel gas pattern. Electronically Signed   By: Anner Crete M.D.   On: 06/25/2022 02:28    Anti-infectives: Anti-infectives (From admission, onward)    Start     Dose/Rate Route Frequency Ordered Stop   06/23/22 1245  cefTRIAXone (ROCEPHIN) 2 g in sodium chloride 0.9 % 100 mL IVPB        2 g 200 mL/hr over 30 Minutes Intravenous Every 24 hours 06/23/22 1239 06/30/22 1244   06/23/22 1245  metroNIDAZOLE (FLAGYL) IVPB 500 mg        500 mg 100 mL/hr over 60 Minutes Intravenous Every 12 hours 06/23/22 1239 06/30/22 1244        Assessment/Plan  Hemoperitoneum from spontaneous intra-abdominal bleed after lifting bench ABL anemia  -  repeat CT AP yesterday with  slight improvement and no worsening hemorrhage - hgb stable at 8.2 - tolerating diet and having bowel function  - no indication for surgical intervention - stable for DC from a surgical standpoint   LOS: 2 days      Norm Parcel, [facility] 06/26/2022, 8:30 AM Please see Amion for pager number during day hours 7:00am-4:30pm

## 2022-06-26 NOTE — Discharge Instructions (Signed)
Follow with Primary MD Ria Bush, MD in 7 days, do not lift anything heavier than 5 pounds for 8 weeks.  Get CBC, CMP  -  checked next visit with your primary MD    Activity: As tolerated with Full fall precautions use walker/cane & assistance as needed  Disposition Home   Diet: Heart Healthy   Special Instructions: If you have smoked or chewed Tobacco  in the last 2 yrs please stop smoking, stop any regular Alcohol  and or any Recreational drug use.  On your next visit with your primary care physician please Get Medicines reviewed and adjusted.  Please request your Prim.MD to go over all Hospital Tests and Procedure/Radiological results at the follow up, please get all Hospital records sent to your Prim MD by signing hospital release before you go home.  If you experience worsening of your admission symptoms, develop shortness of breath, life threatening emergency, suicidal or homicidal thoughts you must seek medical attention immediately by calling 911 or calling your MD immediately  if symptoms less severe.  You Must read complete instructions/literature along with all the possible adverse reactions/side effects for all the Medicines you take and that have been prescribed to you. Take any new Medicines after you have completely understood and accpet all the possible adverse reactions/side effects.

## 2022-06-26 NOTE — Plan of Care (Signed)
                                      [place]                            [address] [place], [place] [address]      David Moss was admitted to the Hospital on 06/23/2022 and Discharged  06/26/2022 and should be excused from work/school   for 10 days starting from date -  06/23/2022 , may return to work, do not lift anything heavier than 5 pounds for the next 8 weeks.  Thereafter per PCP .  Call Lala Lund MD, [facility]  [phone] with questions.  Lala Lund M.D on 06/26/2022,at 8:19 AM  [facility]   Office  [phone]

## 2022-06-26 NOTE — Plan of Care (Signed)

## 2022-06-26 NOTE — Discharge Summary (Signed)
David Moss BCW:[id number] DOB: 08-26-1965 DOA: 06/23/2022  PCP: Ria Bush, MD  Admit date: 06/23/2022  Discharge date: 06/26/2022  Admitted From: Home   Disposition:  Home   Recommendations for Outpatient Follow-up:   Follow up with PCP in 1-2 weeks  PCP Please obtain BMP/CBC, 2 view CXR in 1week,  (see Discharge instructions)   PCP Please follow up on the following pending results: PCP to monitor CBC closely, needs close outpatient follow-up office CBC and anemia panel.  Outpatient pulmonary follow-up within the month for lung nodule GI follow-up for fatty liver.   Home Health: None   Equipment/Devices: None  Consultations: CCS Discharge Condition: Stable    CODE STATUS: Full    Diet Recommendation: Heart Healthy     Chief Complaint  Patient presents with   Abdominal Pain   Loss of Consciousness     Brief history of present illness from the day of admission and additional interim summary    56 y.o. male with medical history significant of migraine headaches and GERD who presented with complaints of abdominal pain.  Symptoms started around 2:30 AM this morning and woke him out of his sleep.  He reports having severe pain in the epigastric region with radiation down the center part of his abdomen.  Interestingly he lifted a 400 pound cement bench with the help of a friend a day before that, no other trauma no other preceding symptoms.  He came to the ER where he was diagnosed with hemoperitoneum of unclear etiology, acute blood loss related anemia, hypotension and AKI.  Hypoperfusion related lactic acidosis, no sepsis.  CCS was consulted and he was admitted to hospitalist service                                                                  Hospital Course   Hemoperitoneum causing abdominal  pain, acute blood loss related anemia.  No preceding symptoms except that he lifted a heavy cement bench weighing 400 pounds a day before, remote history of alcohol abuse has not had a drink in months except 1 bottle of beer last week, general surgery following, stable INR, not on any blood thinners.  No other history suggestive of infection or trauma to the abdomen, received total 2 units of packed RBC on 06/25/2022 with subsequent stable H&H, now symptom-free no fever, reactionary leukocytosis resolved, symptom-free and eager to go home, cleared by general surgery to be discharged.  Request PCP to monitor his CBC and anemia panel closely, patient requested not to lift anything heavier than 5 pounds for 8 weeks.   2.  AKI.  Due to anemia and hypoperfusion, has been adequately hydrated getting 2 units of packed RBCs.  Renal function back to normal.   3.  Obesity.  BMI of 30.  Follow with PCP.   4.  Incidental finding of 6 mm subpleural nodule within the lateral left lung base - outpatient pulmonary follow-up within a month of discharge to be arranged by PCP.   5.  Fatty liver with remote history of alcohol abuse.  Has not had alcohol drinks on a regular basis for months, counseled to continue to stain, PCP to monitor fatty liver.   6.  Reactionary leukocytosis.  Likely due to #1 above, continue empiric antibiotics for now.  Clinically appears nontoxic, no sepsis.  7.  Hypertension.  Placed on Coreg.  PCP to monitor and adjust.   Discharge diagnosis     Principal Problem:   Hemoperitoneum Active Problems:   Symptomatic anemia   Syncope   Transient hypotension   Leukocytosis   Lactic acidosis   AKI (acute kidney injury) (HCC)   GERD (gastroesophageal reflux disease)   Obesity (BMI 30-39.9)    Discharge instructions    Discharge Instructions     Ambulatory referral to Pulmonology   Complete by: As directed    Reason for referral: Lung Mass/Lung Nodule   Diet - low sodium heart  healthy   Complete by: As directed    Discharge instructions   Complete by: As directed    Follow with Primary MD Ria Bush, MD in 7 days, do not lift anything heavier than 5 pounds for 8 weeks.  Get CBC, CMP  -  checked next visit with your primary MD    Activity: As tolerated with Full fall precautions use walker/cane & assistance as needed  Disposition Home   Diet: Heart Healthy   Special Instructions: If you have smoked or chewed Tobacco  in the last 2 yrs please stop smoking, stop any regular Alcohol  and or any Recreational drug use.  On your next visit with your primary care physician please Get Medicines reviewed and adjusted.  Please request your Prim.MD to go over all Hospital Tests and Procedure/Radiological results at the follow up, please get all Hospital records sent to your Prim MD by signing hospital release before you go home.  If you experience worsening of your admission symptoms, develop shortness of breath, life threatening emergency, suicidal or homicidal thoughts you must seek medical attention immediately by calling 911 or calling your MD immediately  if symptoms less severe.  You Must read complete instructions/literature along with all the possible adverse reactions/side effects for all the Medicines you take and that have been prescribed to you. Take any new Medicines after you have completely understood and accpet all the possible adverse reactions/side effects.   Increase activity slowly   Complete by: As directed        Discharge Medications   Allergies as of 06/26/2022   No Known Allergies      Medication List     TAKE these medications    acetaminophen 325 MG tablet Commonly known as: TYLENOL Take 2 tablets (650 mg total) by mouth every 6 (six) hours as needed for mild pain or moderate pain.   carvedilol 3.125 MG tablet Commonly known as: COREG Take 1 tablet (3.125 mg total) by mouth 2 (two) times daily with a meal.    cyclobenzaprine 10 MG tablet Commonly known as: FLEXERIL Take 1 tablet (10 mg total) by mouth 2 (two) times daily as needed for muscle spasms (sedation precautions).   docusate sodium 100 MG capsule Commonly known as: COLACE Take 2 capsules (200 mg total) by mouth 2 (two) times daily as needed for  mild constipation.   ferrous sulfate 325 (65 FE) MG tablet Take 1 tablet (325 mg total) by mouth 2 (two) times daily with a meal.   folic acid 1 MG tablet Commonly known as: FOLVITE Take 1 tablet (1 mg total) by mouth daily.   pantoprazole 40 MG tablet Commonly known as: PROTONIX Take 1 tablet (40 mg total) by mouth daily at 12 noon.   SUMAtriptan 100 MG tablet Commonly known as: IMITREX Take 1 tablet (100 mg total) by mouth once for 1 dose. May repeat in 2 hours if headache persists or recurs.         Follow-up Information     Ria Bush, MD. Schedule an appointment as soon as possible for a visit in 1 week(s).   Specialty: Family Medicine Contact information: [place] [address] [facility], [place]. Schedule an appointment as soon as possible for a visit in 2 week(s).   Specialty: General Surgery Contact information: [address] [place] [phone]                Major procedures and Radiology Reports - PLEASE review detailed and final reports thoroughly  -      CT ABDOMEN PELVIS WO CONTRAST  Result Date: 06/25/2022 CLINICAL DATA:  Abdominal pain. EXAM: CT ABDOMEN AND PELVIS WITHOUT CONTRAST TECHNIQUE: Multidetector CT imaging of the abdomen and pelvis was performed following the standard protocol without IV contrast. RADIATION DOSE REDUCTION: This exam was performed according to the departmental dose-optimization program which includes automated exposure control, adjustment of the mA and/or kV according to patient size and/or use of iterative  reconstruction technique. COMPARISON:  CT abdomen pelvis dated 06/23/2022. FINDINGS: Evaluation of this exam is limited in the absence of intravenous contrast. Lower chest: Bibasilar subpleural atelectasis. No intra-abdominal free air. There is moderate amount of hemoperitoneum. The pneumoperitoneum over the liver dome has slightly decreased in size since the prior CT. However, the higher attenuating blood located in the posterior stomach and extending inferiorly into the left iliac fossa is essentially unchanged since the earlier CT. Hepatobiliary: Fatty liver. No biliary dilatation. High attenuating content within the gallbladder most consistent with a previous excretion of contrast. Pancreas: Unremarkable. No pancreatic ductal dilatation or surrounding inflammatory changes. Spleen: Normal in size without focal abnormality. Adrenals/Urinary Tract: The adrenal glands are unremarkable. There is no hydronephrosis or nephrolithiasis on either side. Ill-defined hypodense lesion in the interpolar left kidney is not characterized on this noncontrast CT and was better evaluated on the prior CT. The visualized ureters appear unremarkable. The urinary bladder is minimally distended and grossly unremarkable. Stomach/Bowel: There is no bowel obstruction. Multiple descending colon diverticula. The appendix is not identified with certainty. Vascular/Lymphatic: Mild aortoiliac atherosclerotic disease. The IVC is grossly unremarkable. No portal venous gas. There is no adenopathy. Reproductive: The prostate and seminal vesicles are grossly unremarkable. No pelvic mass. Other: None Musculoskeletal: No acute or significant osseous findings. IMPRESSION: 1. Similar amount of hemoperitoneum or slightly decreased over the dome of the liver since the prior CT. Continued follow-up as clinically indicated. 2. No bowel obstruction. 3. Fatty liver. 4.  Aortic Atherosclerosis (ICD10-I70.0). Electronically Signed   By: Anner Crete M.D.    On: 06/25/2022 03:49   DG Abd 1 View  Result Date: 06/25/2022 CLINICAL DATA:  Abdominal pain. EXAM: ABDOMEN - 1 VIEW COMPARISON:  Abdominal radiograph dated 12/15/2009. FINDINGS: Evaluation is limited  due to body habitus. No bowel dilatation or evidence of obstruction. Is noted in the colon. The osseous structures are intact. The soft tissues are grossly unremarkable. IMPRESSION: Nonobstructive bowel gas pattern. Electronically Signed   By: Anner Crete M.D.   On: 06/25/2022 02:28   DG Chest Port 1 View  Result Date: 06/24/2022 CLINICAL DATA:  Hemoperitoneum.  Shortness of breath. EXAM: PORTABLE CHEST 1 VIEW COMPARISON:  06/23/2022 FINDINGS: Heart size appears normal. Lung volumes are low and there is asymmetric elevation of the right hemidiaphragm. Increased bibasilar atelectasis compared with the previous study. No pleural fluid or airspace disease. Visualized osseous structures appear intact. IMPRESSION: Low lung volumes with increased bibasilar atelectasis. Electronically Signed   By: Kerby Moors M.D.   On: 06/24/2022 06:24   CT ABDOMEN PELVIS WO CONTRAST  Result Date: 06/23/2022 CLINICAL DATA:  Assess for retroperitoneal bleed EXAM: CT ABDOMEN AND PELVIS WITHOUT CONTRAST TECHNIQUE: Multidetector CT imaging of the abdomen and pelvis was performed following the standard protocol without IV contrast. RADIATION DOSE REDUCTION: This exam was performed according to the departmental dose-optimization program which includes automated exposure control, adjustment of the mA and/or kV according to patient size and/or use of iterative reconstruction technique. COMPARISON:  CT chest, abdomen and pelvis dated June 23, 2022 obtained at 6:50 a.m. FINDINGS: Lower chest: Bibasilar atelectasis Hepatobiliary: No focal liver abnormality is seen. Vicarious excretion of contrast into the gallbladder. No biliary ductal dilation. Pancreas: Unremarkable. No pancreatic ductal dilatation or surrounding  inflammatory changes. Spleen: Normal in size without focal abnormality. Adrenals/Urinary Tract: Bilateral adrenal glands are unremarkable. No hydronephrosis or nephrolithiasis. Excretion of contrast into the renal collecting systems. Unchanged low-attenuation lesion of the left kidney which is likely a simple cysts, no specific follow-up imaging is recommended bladder is unremarkable. Stomach/Bowel: Stomach is within normal limits. Appendix appears normal. No evidence of bowel wall thickening, distention, or inflammatory changes. Vascular/Lymphatic: Aortic atherosclerosis. No enlarged abdominal or pelvic lymph nodes. Reproductive: Prostate is unremarkable. Other: Moderate volume hemoperitoneum hematoma is slightly increased in volume when compared with prior exam. Area of highest attenuation is located posterior to the stomach and extends into the left iliac fossa. Musculoskeletal: No acute or significant osseous findings. IMPRESSION: 1. Moderate volume hemoperitoneum hematoma is slightly increased in volume when compared with prior exam. Area of highest attenuation is located posterior to the stomach and extends into the left iliac fossa, reflecting possible source of hemorrhage as described on prior same day CTA. 2. Aortic Atherosclerosis (ICD10-I70.0). Electronically Signed   By: Yetta Glassman M.D.   On: 06/23/2022 14:48   DG Chest Portable 1 View  Result Date: 06/23/2022 CLINICAL DATA:  Syncope EXAM: PORTABLE CHEST 1 VIEW COMPARISON:  Chest CT from earlier today FINDINGS: Low volume chest. There is no edema, consolidation, effusion, or pneumothorax. Low volume chest accentuated cardiac size and aortic tortuosity, CT was performed earlier the same day. IMPRESSION: Low volume chest.  No evidence of acute cardiopulmonary disease. Electronically Signed   By: Jorje Guild M.D.   On: 06/23/2022 08:12   CT Angio Chest/Abd/Pel for Dissection W and/or Wo Contrast  Result Date: 06/23/2022 CLINICAL DATA:   Concern for aortic aneurysm EXAM: CT ANGIOGRAPHY CHEST, ABDOMEN AND PELVIS TECHNIQUE: Non-contrast CT of the chest was initially obtained. Multidetector CT imaging through the chest, abdomen and pelvis was performed using the standard protocol during bolus administration of intravenous contrast. Multiplanar reconstructed images and MIPs were obtained and reviewed to evaluate the vascular anatomy. RADIATION DOSE REDUCTION: This exam  was performed according to the departmental dose-optimization program which includes automated exposure control, adjustment of the mA and/or kV according to patient size and/or use of iterative reconstruction technique. CONTRAST:  14m OMNIPAQUE IOHEXOL 350 MG/ML SOLN COMPARISON:  None Available. FINDINGS: CTA CHEST FINDINGS Cardiovascular: Preferential opacification of the thoracic aorta. No evidence of thoracic aortic aneurysm or dissection. Mild aortic atherosclerotic calcifications. Normal heart size. No pericardial effusion. Mediastinum/Nodes: No enlarged mediastinal, hilar, or axillary lymph nodes. Thyroid gland, trachea, and esophagus demonstrate no significant findings. Lungs/Pleura: No pleural fluid or airspace disease. No atelectasis or signs of pneumothorax. Subpleural nodule within the lateral left lung base measures 6 mm, image 68/7. Musculoskeletal: No chest wall abnormality. No acute or significant osseous findings. Review of the MIP images confirms the above findings. CTA ABDOMEN AND PELVIS FINDINGS VASCULAR Aorta: Normal caliber aorta without aneurysm, dissection, vasculitis or significant stenosis. Celiac: Patent without evidence of aneurysm, dissection, vasculitis or significant stenosis. SMA: Patent without evidence of aneurysm, dissection, vasculitis or significant stenosis. Renals: Both renal arteries are patent without evidence of aneurysm, dissection, vasculitis, fibromuscular dysplasia or significant stenosis. IMA: Patent without evidence of aneurysm, dissection,  vasculitis or significant stenosis. Inflow: Patent without evidence of aneurysm, dissection, vasculitis or significant stenosis. Veins: No obvious venous abnormality within the limitations of this arterial phase study. Review of the MIP images confirms the above findings. NON-VASCULAR Hepatobiliary: Hepatic steatosis. No focal liver abnormality. Gallbladder appears normal. Pancreas: Unremarkable. No pancreatic ductal dilatation or surrounding inflammatory changes. Spleen: Perisplenic fluid noted. Heterogeneous enhancement on the spleen likely reflects arterial phase. No focal splenic abnormality identified. Adrenals/Urinary Tract: Normal appearance of the adrenal glands. Benign left kidney cyst identified. No follow-up imaging recommended. No signs of nephrolithiasis or hydronephrosis. Urinary bladder is unremarkable. Stomach/Bowel: Stomach appears normal. No pathologic dilatation of the large or small bowel loops. No bowel wall thickening, inflammation, or distension. Lymphatic: No signs of abdominopelvic adenopathy. Reproductive: Prostate is unremarkable. Other: There is low attenuation fluid within the right upper quadrant of the abdomen adjacent to the inferior margin of the right lobe of liver, image 95/6. Within the left hemiabdomen there is fat stranding and hyperdense fluid posterior to the stomach and extending along the along the left hemiabdomen into the left iliac fossa. Imaging findings are concerning for hemoperitoneum. Etiology is indeterminate. Differential considerations include non-traumatic rupture of the spleen or abdominal vasculature. There is a branch off the left gastric artery which extends into this area and there is a small, equivocal blush of contrast around the distal portion of this vessel which may reflect site of hemorrhage, image 72/100 and image 155/6. Musculoskeletal: No acute or significant osseous findings. Review of the MIP images confirms the above findings. IMPRESSION: 1. No  evidence for thoracic or abdominal aortic aneurysm or dissection. 2. There is fat stranding and hyperdense fluid within the left hemiabdomen posterior to the stomach and extending along the left hemiabdomen into the left iliac fossa. Imaging findings are concerning for hemoperitoneum. Etiology is indeterminate. Differential considerations include non-traumatic rupture of the spleen or abdominal vasculature. There is a branch vessel off the left gastric artery which extends into this area with equivocal blush of contrast around the distal portion of this vessel which may reflect site of hemorrhage. 3. Hepatic steatosis. 4. 6 mm subpleural nodule within the lateral left lung base. If the patient is at high risk for bronchogenic carcinoma, follow-up chest CT at 6-12 months is recommended. If the patient is at low risk for bronchogenic carcinoma, follow-up  chest CT at 12 months is recommended. This recommendation follows the consensus statement: Guidelines for Management of Small Pulmonary Nodules Detected on CT Scans: A Statement from the [employer] as published in Radiology 2005;237:395-400. 5. Critical Value/emergent results were called by telephone at the time of interpretation on 06/23/2022 at 7:50 am to provider [facility] , who verbally acknowledged these results. Electronically Signed   By: Kerby Moors M.D.   On: 06/23/2022 07:51    Micro Results    Recent Results (from the past 240 hour(s))  Culture, blood (routine x 2)     Status: None (Preliminary result)   Collection Time: 06/23/22  7:45 AM   Specimen: BLOOD  Result Value Ref Range Status   Specimen Description BLOOD SITE NOT SPECIFIED  Final   Special Requests   Final    BOTTLES DRAWN AEROBIC AND ANAEROBIC Blood Culture adequate volume   Culture   Final    NO GROWTH 2 DAYS Performed at [facility], [address], [place], [place] [address]    Report Status PENDING  Incomplete  Culture, blood (routine x 2)      Status: None (Preliminary result)   Collection Time: 06/23/22  7:45 AM   Specimen: BLOOD  Result Value Ref Range Status   Specimen Description BLOOD SITE NOT SPECIFIED  Final   Special Requests   Final    BOTTLES DRAWN AEROBIC AND ANAEROBIC Blood Culture adequate volume   Culture   Final    NO GROWTH 2 DAYS Performed at [facility], [address] [place], [place] [address]    Report Status PENDING  Incomplete  MRSA Next Gen by PCR, Nasal     Status: None   Collection Time: 06/23/22  5:19 PM   Specimen: Nasal Mucosa; Nasal Swab  Result Value Ref Range Status   MRSA by PCR Next Gen NOT DETECTED NOT DETECTED Final    Comment: (NOTE) The GeneXpert MRSA Assay (FDA approved for NASAL specimens only), is one component of a comprehensive MRSA colonization surveillance program. It is not intended to diagnose MRSA infection nor to guide or monitor treatment for MRSA infections. Test performance is not FDA approved in patients less than 44 years old. Performed at [facility], [place] [address] [place], [place] [address]     Today   Subjective    Matej Sappenfield today has no headache,no chest abdominal pain,no new weakness tingling or numbness, feels much better wants to go home today.    Objective   Blood pressure (!) 149/88, pulse 77, temperature 98.2 F (36.8 C), temperature source Oral, resp. rate 19, height '6\' 2"'$  (1.88 m), weight 124.1 kg, SpO2 98 %.   Intake/Output Summary (Last 24 hours) at 06/26/2022 0829 Last data filed at 06/26/2022 0342 Gross per 24 hour  Intake 672.23 ml  Output 3300 ml  Net -2627.77 ml    Exam  Awake Alert, No new F.N deficits,    Esto.AT,PERRAL Supple Neck,   Symmetrical Chest wall movement, Good air movement bilaterally, CTAB RRR,No Gallops,   +ve B.Sounds, Abd Soft, Non tender,  No Cyanosis, Clubbing or edema    Data Review   Recent Labs  Lab 06/23/22 0637 06/23/22 0856 06/24/22 1604 06/25/22 0030 06/25/22 1450  06/26/22 0036 06/26/22 0558  WBC 18.4*   < > 11.1* 9.5 7.7 7.0 6.3  HGB 12.6*   < > 8.0* 7.1* 8.2* 8.0* 8.2*  HCT 38.3*   < > 23.8* 21.8* 23.4*  24.0* 24.3*  PLT 316   < > 224 181 151 147* 147*  MCV 90.1   < > 88.8 91.2 87.3 88.9 88.7  MCH 29.6   < > 29.9 29.7 30.6 29.6 29.9  MCHC 32.9   < > 33.6 32.6 35.0 33.3 33.7  RDW 12.8   < > 13.3 13.3 13.7 13.7 13.6  LYMPHSABS 2.3  --   --  1.2  --  1.3  --   MONOABS 1.1*  --   --  1.0  --  0.6  --   EOSABS 0.1  --   --  0.0  --  0.1  --   BASOSABS 0.1  --   --  0.0  --  0.0  --    < > = values in this interval not displayed.    Recent Labs  Lab 06/23/22 0637 06/23/22 0756 06/23/22 0923 06/23/22 1200 06/23/22 1500 06/23/22 1758 06/24/22 0021 06/24/22 0652 06/25/22 0030 06/26/22 0036  NA 137  --   --   --   --   --   --  137 137 138  K 3.6  --   --   --   --   --   --  4.4 4.1 3.7  CL 103  --   --   --   --   --   --  104 103 103  CO2 22  --   --   --   --   --   --  '22 26 30  '$ ANIONGAP 12  --   --   --   --   --   --  '11 8 5  '$ GLUCOSE 180*  --   --   --   --   --   --  151* 125* 112*  BUN 15  --   --   --   --   --   --  25* 16 11  CREATININE 1.60*  --   --   --   --   --   --  1.58* 1.25* 1.12  AST 25  --   --   --   --   --   --   --  23 19  ALT 31  --   --   --   --   --   --   --  23 20  ALKPHOS 38  --   --   --   --   --   --   --  30* 34*  BILITOT 0.8  --   --   --   --   --   --   --  1.1 1.4*  ALBUMIN 3.5  --   --   --   --   --   --   --  3.1* 3.0*  CRP  --   --   --   --   --  1.8*  --  3.9* 8.3* 10.8*  LATICACIDVEN  --  2.9* 3.1* 4.0* 3.4*  --   --   --   --   --   INR  --   --   --   --   --  1.0 1.0  --   --   --   BNP  --   --   --   --   --   --   --   --  20.7 90.3  MG  --   --   --   --   --   --   --  1.9 1.8 2.1  CALCIUM 9.4  --   --   --   --   --   --  8.5* 8.4* 8.2*     Total Time in preparing paper work, data evaluation and todays exam - 35 minutes  Signature  -    Lala Lund M.D on 06/26/2022 at  8:29 AM   -  To page go to [email or web address]

## 2022-06-26 NOTE — Progress Notes (Signed)
Discharge instructions given. Patient verbalized understanding and al questions were answered.

## 2022-06-27 ENCOUNTER — Telehealth: Payer: Self-pay

## 2022-06-27 NOTE — Telephone Encounter (Signed)
Transition Care Management Unsuccessful Follow-up Telephone Call  Date of discharge and from where:  [facility] 06/26/2022  Attempts:  1st Attempt  Reason for unsuccessful TCM follow-up call:  Unable to leave message Juanda Crumble, [place] Direct Dial [phone]

## 2022-06-28 ENCOUNTER — Telehealth: Payer: Self-pay

## 2022-06-28 LAB — CULTURE, BLOOD (ROUTINE X 2)
Culture: NO GROWTH
Culture: NO GROWTH
Special Requests: ADEQUATE
Special Requests: ADEQUATE

## 2022-06-28 NOTE — Telephone Encounter (Signed)
I spoke with pt's wife;(DPR signed) Mrs Tarango said that pt is not going to [facility] now he wants to wait and see how he does and then if he feels necessary he will go to [facility]. Mrs Guiffre said she cannot force him to go to [facility]. I voiced understanding and will send note To Dr Darnell Level who is out of office today, Dr Silvio Pate who is in office today and G pool.

## 2022-06-28 NOTE — Telephone Encounter (Signed)
Transition Care Management Unsuccessful Follow-up Telephone Call  Date of discharge and from where:  [facility] 06/26/2022  Attempts:  2nd Attempt  Reason for unsuccessful TCM follow-up call:  Left voice message Juanda Crumble, [place] Direct Dial [phone]

## 2022-06-28 NOTE — Telephone Encounter (Signed)
Noted! Thank you

## 2022-06-28 NOTE — Telephone Encounter (Signed)
[facility] - Client TELEPHONE ADVICE RECORD [facility] Patient Name: YVES FODOR Gender: Male DOB: Jan 27, 1966 Age: 56 Y 36 M 17 D Return Phone Number: [phone] (Primary) Address: City/ State/ Zip: [place]  [address] Client [facility] - Client Client Site [facility] Provider Ria Bush - MD Contact Type Call Who Is Calling Patient / Member / Family / Caregiver Call Type Triage / Clinical Caller Name Lavin Petteway Relationship To Patient Spouse Return Phone Number [phone] (Primary) Chief Complaint Diarrhea Reason for Call Symptomatic / Request for [place] states her husband was released on Tuesday from the hospital and he has now developed diarrhea. She is not sure if this is due to medication ( has a appt on Tuesday). Translation No Nurse Assessment Nurse: Tanya Nones, RN, Jordan Hawks Date/Time (Eastern Time): 06/28/2022 3:07:49 PM Confirm and document reason for call. If symptomatic, describe symptoms. ---Caller states her husband was released on Tuesday from the hospital due to GI bleed. No intervention was needed. He has now developed diarrhea since yesterday. The diarrhea is dark brown, and she reports it doesn't look like there is any blood in it. It is yellow (like baby poop). He was put on new medication (folic acid, pantoprazole, ferosul, carvedilol, and taking tylenol PRN) and is wondering if this is due to medication (has a appt on Tuesday). Does the patient have any new or worsening symptoms? ---Yes Will a triage be completed? ---Yes Related visit to physician within the last 2 weeks? ---Yes Does the PT have any chronic conditions? (i.e. diabetes, asthma, this includes High risk factors for pregnancy, etc.) ---Yes List chronic conditions. ---GI bleed, on BP meds new from the hospital Is this a behavioral health or  substance abuse call? ---No Guidelines Guideline Title Affirmed Question Affirmed Notes Nurse Date/Time (Eastern Time) Diarrhea [1] MODERATE diarrhea (e.g., 4-6 [place], [place], Jordan Hawks 06/28/2022 3:12:06 PM PLEASE NOTE: All timestamps contained within this report are represented as [place] Standard Time. CONFIDENTIALTY NOTICE: This fax transmission is intended only for the addressee. It contains information that is legally privileged, confidential or otherwise protected from use or disclosure. If you are not the intended recipient, you are strictly prohibited from reviewing, disclosing, copying using or disseminating any of this information or taking any action in reliance on or regarding this information. If you have received this fax in error, please notify us immediately by telephone so that we can arrange for its return to [place]. Phone: [phone], Toll-Free: [phone], Fax: [phone] Page: 2 of 2 Call Id: [id number] Guidelines Guideline Title Affirmed Question Affirmed Notes Nurse Date/Time Eilene Ghazi Time) times / day more than normal) AND [2] present > 48 hours (2 days) Disp. Time Eilene Ghazi Time) Disposition Final User 06/28/2022 3:19:47 PM See PCP within 24 Hours Yes Tanya Nones, [place], [place] Final Disposition 06/28/2022 3:19:47 PM See PCP within 24 Hours Yes Tanya Nones, RN, [place] Shipper Disagree/Comply Comply Caller Understands Yes PreDisposition [place] Advice Given Per Guideline SEE PCP WITHIN 24 HOURS: * IF OFFICE WILL BE CLOSED: You need to be seen within the next 24 hours. A clinic or an urgent care center is often a good source of care if your doctor's office is closed or you can't get an appointment. FLUID THERAPY DURING MILD TO MODERATE DIARRHEA: * Drink more fluids, at least 8 to 10 cups daily. One cup equals 8 oz (240 ml). * SPORTS DRINKS: You can also drink half-strength sports  drinks (e.g., Gatorade, Powerade) to help treat and prevent dehydration. Mix  the sports drink half and half with water. * AVOID caffeinated beverages. Reason: Caffeine is mildly dehydrating. * AVOID alcohol beverages (e.g., beer, wine, hard liquor). * AVOID carbonated soft drinks (soda) as these can make your diarrhea worse. DIARRHEA MEDICINE - BISMUTH SUBSALICYLATE (E.G., PEPTO-BISMOL): * This medicine can help reduce diarrhea, vomiting, and abdominal cramping. It is available over-the-counter (OTC) in a drugstore. CALL BACK IF: * Signs of dehydration occur (e.g., no urine over 12 hours, very dry mouth, lightheaded, etc.) * Bloody stools * Constant or severe abdomen pain * You become worse CARE ADVICE given per Diarrhea (Adult) guideline. Comments User: Cloretta Ned, RN Date/Time Eilene Ghazi Time): 06/28/2022 3:21:27 PM backline had no available appts. so i referred pt to [place]

## 2022-06-28 NOTE — Telephone Encounter (Signed)
David Carbon, MD  to Me  David Bush, MD  [name]     06/28/22  4:55 PM  It could be from some of the medication (especially the iron). If he feels okay, and no recurrence of bleeding, probably okay to wait for his appt next week.   Pts wife notified as instructed and voiced understanding. Pt said he just urinated and no diarrhea that time and pt will see how does tonight and will go from there. UC & ED precautions reviewed and pts wife voiced understanding and appreciative of call. Thank you.

## 2022-06-29 NOTE — Telephone Encounter (Signed)
Transition Care Management Unsuccessful Follow-up Telephone Call  Date of discharge and from where:  [facility] 06/26/2022  Attempts:  3rd Attempt  Reason for unsuccessful TCM follow-up call:  Left voice message Juanda Crumble, [place] Direct Dial [phone]

## 2022-07-03 ENCOUNTER — Encounter: Payer: Self-pay | Admitting: Family Medicine

## 2022-07-03 ENCOUNTER — Ambulatory Visit (INDEPENDENT_AMBULATORY_CARE_PROVIDER_SITE_OTHER)
Admission: RE | Admit: 2022-07-03 | Discharge: 2022-07-03 | Disposition: A | Discharge status: Home / Self Care | Payer: Managed Care, Other (non HMO) | Source: Ambulatory Visit | Attending: Family Medicine | Admitting: Family Medicine

## 2022-07-03 ENCOUNTER — Ambulatory Visit: Payer: Managed Care, Other (non HMO) | Admitting: Family Medicine

## 2022-07-03 VITALS — BP 116/68 | HR 78 | Temp 96.6°F | Ht 74.0 in | Wt 260.4 lb

## 2022-07-03 DIAGNOSIS — D62 Acute posthemorrhagic anemia: Secondary | ICD-10-CM

## 2022-07-03 DIAGNOSIS — K2100 Gastro-esophageal reflux disease with esophagitis, without bleeding: Secondary | ICD-10-CM

## 2022-07-03 DIAGNOSIS — E669 Obesity, unspecified: Secondary | ICD-10-CM

## 2022-07-03 DIAGNOSIS — R911 Solitary pulmonary nodule: Secondary | ICD-10-CM | POA: Diagnosis not present

## 2022-07-03 DIAGNOSIS — E66811 Obesity, class 1: Secondary | ICD-10-CM

## 2022-07-03 DIAGNOSIS — N179 Acute kidney failure, unspecified: Secondary | ICD-10-CM

## 2022-07-03 DIAGNOSIS — K661 Hemoperitoneum: Secondary | ICD-10-CM

## 2022-07-03 DIAGNOSIS — Z6830 Body mass index (BMI) 30.0-30.9, adult: Secondary | ICD-10-CM

## 2022-07-03 LAB — BASIC METABOLIC PANEL
BUN: 17 mg/dL (ref 6–23)
CO2: 30 mEq/L (ref 19–32)
Calcium: 9.7 mg/dL (ref 8.4–10.5)
Chloride: 97 mEq/L (ref 96–112)
Creatinine, Ser: 1.04 mg/dL (ref 0.40–1.50)
GFR: 81.37 mL/min (ref >60.00)
Glucose, Bld: 97 mg/dL (ref 70–99)
Potassium: 4.5 mEq/L (ref 3.5–5.1)
Sodium: 135 mEq/L (ref 135–145)

## 2022-07-03 LAB — CBC WITH DIFFERENTIAL/PLATELET
Basophils Absolute: 0 10*3/uL (ref 0.0–0.1)
Basophils Relative: 0.5 % (ref 0.0–3.0)
Eosinophils Absolute: 0.2 10*3/uL (ref 0.0–0.7)
Eosinophils Relative: 1.7 % (ref 0.0–5.0)
HCT: 36.2 % — ABNORMAL LOW (ref 39.0–52.0)
Hemoglobin: 12.1 g/dL — ABNORMAL LOW (ref 13.0–17.0)
Lymphocytes Relative: 14 % (ref 12.0–46.0)
Lymphs Abs: 1.2 10*3/uL (ref 0.7–4.0)
MCHC: 33.4 g/dL (ref 30.0–36.0)
MCV: 87.4 fl (ref 78.0–100.0)
Monocytes Absolute: 0.8 10*3/uL (ref 0.1–1.0)
Monocytes Relative: 9.2 % (ref 3.0–12.0)
Neutro Abs: 6.6 10*3/uL (ref 1.4–7.7)
Neutrophils Relative %: 74.6 % (ref 43.0–77.0)
Platelets: 353 10*3/uL (ref 150.0–400.0)
RBC: 4.14 Mil/uL — ABNORMAL LOW (ref 4.22–5.81)
RDW: 14.2 % (ref 11.5–15.5)
WBC: 8.8 10*3/uL (ref 4.0–10.5)

## 2022-07-03 MED ORDER — FERROUS SULFATE 325 (65 FE) MG PO TABS: 325.0000 mg | ORAL_TABLET | Freq: Every day | ORAL | Status: DC

## 2022-07-03 NOTE — Progress Notes (Signed)
Patient ID: David Moss, male    DOB: 10-26-1965, 57 y.o.   MRN: [id number]  This visit was conducted in person.  BP 116/68 (BP Location: Left Arm, Patient Position: Sitting)   Pulse 78   Temp (!) 96.6 F (35.9 C) (Skin)   Ht '6\' 2"'$  (1.88 m)   Wt 260 lb 6 oz (118.1 kg)   SpO2 95%   BMI 33.43 kg/m    CC: hosp f/u visit  Subjective:   HPI: David Moss is a 57 y.o. male presenting on 07/03/2022 for Follow-up (hospital) Here with wife Magda Paganini   Last seen 10/2019.   Recent hospitalization for severe epigastric abdominal pain after lifting 400 lb cement bench the day before, seen at ER where workup showed hemoperitoneum of unclear etiology, acute blood loss anemia nadir Hgb 7.1, and hypotension with AKI.  Hospital records reviewed. Med rec performed.  He was placed on carvedilol 3.'125mg'$  bid as well as pantoprazole daily. He was placed on flexeril and iron tablets along with colace stool softener.   Since home, slowly feeling better but still very tired especially with exertion. No current dyspnea.  Had episodes of diarrhea that are now resolved.  No significant alcohol use.   Incidentally found 53m subpleural nodule to LLL - rec pulm f/u for this in ex-smoker (quit prior to 2000). No fmhx lung cancer. They would be interested in seeing pulm to follow lung nodule.   Received 2u pRBC on 06/25/2022.  H/o L inguinal hernia repair 2011.   Home health not set up.  Other follow up appointments scheduled: none ______________________________________________________________________ Hospital admission: 06/23/2022 Hospital discharge: 06/26/2022 TCM f/u phone call: attempted x3, unsuccessfully  Discharge diagnoses: Principal Problem:   Hemoperitoneum Active Problems:   Symptomatic anemia   Syncope   Transient hypotension   Leukocytosis   Lactic acidosis   AKI (acute kidney injury) ([facility]   GERD (gastroesophageal reflux disease)   Obesity (BMI 30-39.9)  Recommendations  for Outpatient Follow-up:  Follow up with PCP in 1-2 weeks PCP Please obtain BMP/CBC, 2 view CXR in 1week,  (see Discharge instructions)  PCP Please follow up on the following pending results: PCP to monitor CBC closely, needs close outpatient follow-up office CBC and anemia panel. Outpatient pulmonary follow-up within the month for lung nodule GI follow-up for fatty liver       Relevant past medical, surgical, family and social history reviewed and updated as indicated. Interim medical history since our last visit reviewed. Allergies and medications reviewed and updated. Outpatient Medications Prior to Visit  Medication Sig Dispense Refill   acetaminophen (TYLENOL) 500 MG tablet Take 2 tablets (1,000 mg total) by mouth 3 (three) times daily as needed for moderate pain.     carvedilol (COREG) 3.125 MG tablet Take 1 tablet (3.125 mg total) by mouth 2 (two) times daily with a meal. 60 tablet 0   cyclobenzaprine (FLEXERIL) 10 MG tablet Take 1 tablet (10 mg total) by mouth 2 (two) times daily as needed for muscle spasms (sedation precautions). 30 tablet 1   folic acid (FOLVITE) 1 MG tablet Take 1 tablet (1 mg total) by mouth daily. 30 tablet 0   pantoprazole (PROTONIX) 40 MG tablet Take 1 tablet (40 mg total) by mouth daily at 12 noon. 30 tablet 0   SUMAtriptan (IMITREX) 100 MG tablet Take 100 mg by mouth every 2 (two) hours as needed for migraine. May repeat in 2 hours if headache persists or recurs.  acetaminophen (TYLENOL) 325 MG tablet Take 2 tablets (650 mg total) by mouth every 6 (six) hours as needed for mild pain or moderate pain. 20 tablet 0   ferrous sulfate 325 (65 FE) MG tablet Take 1 tablet (325 mg total) by mouth 2 (two) times daily with a meal. 60 tablet 0   SUMAtriptan (IMITREX) 100 MG tablet Take 1 tablet (100 mg total) by mouth once for 1 dose. May repeat in 2 hours if headache persists or recurs. 10 tablet 3   docusate sodium (COLACE) 100 MG capsule Take 2 capsules (200 mg  total) by mouth 2 (two) times daily as needed for mild constipation. (Patient not taking: Reported on 07/03/2022) 20 capsule 0   No facility-administered medications prior to visit.     Per HPI unless specifically indicated in ROS section below Review of Systems  Objective:  BP 116/68 (BP Location: Left Arm, Patient Position: Sitting)   Pulse 78   Temp (!) 96.6 F (35.9 C) (Skin)   Ht '6\' 2"'$  (1.88 m)   Wt 260 lb 6 oz (118.1 kg)   SpO2 95%   BMI 33.43 kg/m   Wt Readings from Last 3 Encounters:  07/03/22 260 lb 6 oz (118.1 kg)  06/23/22 273 lb 9.5 oz (124.1 kg)  10/30/19 240 lb 2 oz (108.9 kg)      Physical Exam Vitals and nursing note reviewed.  Constitutional:      Appearance: Normal appearance. He is obese. He is not ill-appearing.  HENT:     Head: Normocephalic and atraumatic.     Mouth/Throat:     Mouth: Mucous membranes are moist.     Pharynx: Oropharynx is clear. No oropharyngeal exudate or posterior oropharyngeal erythema.  Eyes:     Extraocular Movements: Extraocular movements intact.     Pupils: Pupils are equal, round, and reactive to light.  Neck:     Thyroid: No thyroid mass or thyromegaly.  Cardiovascular:     Rate and Rhythm: Normal rate and regular rhythm.     Pulses: Normal pulses.     Heart sounds: Normal heart sounds. No murmur heard. Pulmonary:     Effort: Pulmonary effort is normal. No respiratory distress.     Breath sounds: Normal breath sounds. No wheezing, rhonchi or rales.  Abdominal:     General: Bowel sounds are normal. There is no distension.     Palpations: Abdomen is soft. There is no mass.     Tenderness: There is abdominal tenderness (mild-mod) in the epigastric area, periumbilical area, left upper quadrant and left lower quadrant. There is no right CVA tenderness, left CVA tenderness, guarding or rebound. Negative signs include Murphy's sign.     Hernia: No hernia is present.  Musculoskeletal:     Right lower leg: No edema.     Left  lower leg: No edema.  Skin:    General: Skin is warm and dry.     Findings: No rash.  Neurological:     Mental Status: He is alert.  Psychiatric:        Mood and Affect: Mood normal.        Behavior: Behavior normal.        Assessment & Plan:   Problem List Items Addressed This Visit     GERD (gastroesophageal reflux disease)    Continue daily PPI preventatively.       Hemoperitoneum - Primary    Sudden hemoperitoneum after severe strain at work.  Records reviewed.  Bleed thought  to have come from a vessel of L gastric artery according to CTA abd/pelvis.  Discussed light duty for 8 wks then slowly advance as able. Letter for work provided today. Reviewed signs/symptoms to need return to ER for urgent evaluation.  Will also update CBC, BMP, and CXR today.       Relevant Orders   Basic metabolic panel   CBC with Differential/Platelet   DG Chest 2 View   AKI (acute kidney injury) ([facility])    Hypotension related, now resolved.       Obesity, Class I, BMI 30-34.9   Pulmonary nodule    16m LLL pulm nodule incidentally noted on CT imaging while hospitalized. Ex smoker, quit remotely. No fmhx lung cancer. Anticipate will need rpt CT 6-12 months.  They agree with pulm follow up for nodule.       Relevant Orders   Ambulatory referral to Pulmonology   ABLA (acute blood loss anemia)    Update CBC.  Continue oral iron and folate.  He's had dark stools and nausea likely side effects of oral iron - drop iron replacement to once daily.       Relevant Medications   ferrous sulfate 325 (65 FE) MG tablet     Meds ordered this encounter  Medications   ferrous sulfate 325 (65 FE) MG tablet    Sig: Take 1 tablet (325 mg total) by mouth daily with breakfast.   Orders Placed This Encounter  Procedures   DG Chest 2 View    Standing Status:   Future    Number of Occurrences:   1    Standing Expiration Date:   07/04/2023    Order Specific Question:   Reason for Exam (SYMPTOM  OR  DIAGNOSIS REQUIRED)    Answer:   follow up hemoperitoneum    Order Specific Question:   Preferred imaging location?    Answer:   [facility]  Basic metabolic panel   CBC with Differential/Platelet   Ambulatory referral to Pulmonology    Referral Priority:   Routine    Referral Type:   Consultation    Referral Reason:   Specialty Services Required    Requested Specialty:   Pulmonary Disease    Number of Visits Requested:   1    Patient Instructions  Good to see you today Letter for work provided today  Labs and chest xray today.  Take it easy for next 6 weeks, use pain as guide, listen to your body.   Follow up plan: Return in about 3 months (around 10/02/2022) for annual exam, prior fasting for blood work.  JRia Bush MD

## 2022-07-03 NOTE — Assessment & Plan Note (Signed)
24m LLL pulm nodule incidentally noted on CT imaging while hospitalized. Ex smoker, quit remotely. No fmhx lung cancer. Anticipate will need rpt CT 6-12 months.  They agree with pulm follow up for nodule.

## 2022-07-03 NOTE — Assessment & Plan Note (Signed)
Hypotension related, now resolved.

## 2022-07-03 NOTE — Patient Instructions (Addendum)
Good to see you today Letter for work provided today  Labs and chest xray today.  Take it easy for next 6 weeks, use pain as guide, listen to your body.

## 2022-07-03 NOTE — Assessment & Plan Note (Addendum)
Sudden hemoperitoneum after severe strain at work.  Records reviewed.  Bleed thought to have come from a vessel of L gastric artery according to CTA abd/pelvis.  Discussed light duty for 8 wks then slowly advance as able. Letter for work provided today. Reviewed signs/symptoms to need return to ER for urgent evaluation.  Will also update CBC, BMP, and CXR today.

## 2022-07-03 NOTE — Assessment & Plan Note (Addendum)
Continue daily PPI preventatively.

## 2022-07-03 NOTE — Assessment & Plan Note (Addendum)
Update CBC.  Continue oral iron and folate.  He's had dark stools and nausea likely side effects of oral iron - drop iron replacement to once daily.

## 2022-07-04 ENCOUNTER — Telehealth: Payer: Self-pay | Admitting: Family Medicine

## 2022-07-04 ENCOUNTER — Other Ambulatory Visit: Payer: Self-pay | Admitting: Family Medicine

## 2022-07-04 DIAGNOSIS — K661 Hemoperitoneum: Secondary | ICD-10-CM

## 2022-07-04 NOTE — Telephone Encounter (Signed)
Attempted to contact patient, phone goes directly to VM and there is no mailbox set up. Will try call again later.

## 2022-07-04 NOTE — Telephone Encounter (Signed)
Reviewing discharge instructions, looks like he was recommended f/u with [facility] [place] [facility] in 2 weeks. Do they have this appt? If not I will place referral.   They should be able to call to schedule appt: [address] [place] [address] [phone]

## 2022-07-06 ENCOUNTER — Emergency Department (HOSPITAL_BASED_OUTPATIENT_CLINIC_OR_DEPARTMENT_OTHER)
Admission: EM | Admit: 2022-07-06 | Discharge: 2022-07-06 | Disposition: A | Discharge status: Home / Self Care | Payer: Managed Care, Other (non HMO) | Attending: Emergency Medicine | Admitting: Emergency Medicine

## 2022-07-06 ENCOUNTER — Emergency Department (HOSPITAL_BASED_OUTPATIENT_CLINIC_OR_DEPARTMENT_OTHER): Payer: Managed Care, Other (non HMO)

## 2022-07-06 ENCOUNTER — Other Ambulatory Visit: Payer: Self-pay

## 2022-07-06 ENCOUNTER — Encounter (HOSPITAL_BASED_OUTPATIENT_CLINIC_OR_DEPARTMENT_OTHER): Payer: Self-pay

## 2022-07-06 DIAGNOSIS — R531 Weakness: Secondary | ICD-10-CM | POA: Insufficient documentation

## 2022-07-06 DIAGNOSIS — R1032 Left lower quadrant pain: Secondary | ICD-10-CM | POA: Insufficient documentation

## 2022-07-06 LAB — COMPREHENSIVE METABOLIC PANEL
ALT: 18 U/L (ref 0–44)
AST: 14 U/L — ABNORMAL LOW (ref 15–41)
Albumin: 4.1 g/dL (ref 3.5–5.0)
Alkaline Phosphatase: 57 U/L (ref 38–126)
Anion gap: 11 (ref 5–15)
BUN: 15 mg/dL (ref 6–20)
CO2: 28 mmol/L (ref 22–32)
Calcium: 9.7 mg/dL (ref 8.9–10.3)
Chloride: 98 mmol/L (ref 98–111)
Creatinine, Ser: 1.02 mg/dL (ref 0.61–1.24)
GFR, Estimated: >60 mL/min (ref >60)
Glucose, Bld: 132 mg/dL — ABNORMAL HIGH (ref 70–99)
Potassium: 4.2 mmol/L (ref 3.5–5.1)
Sodium: 137 mmol/L (ref 135–145)
Total Bilirubin: 1.6 mg/dL — ABNORMAL HIGH (ref 0.3–1.2)
Total Protein: 7.9 g/dL (ref 6.5–8.1)

## 2022-07-06 LAB — URINALYSIS, ROUTINE W REFLEX MICROSCOPIC
Bilirubin Urine: NEGATIVE
Glucose, UA: NEGATIVE mg/dL
Hgb urine dipstick: NEGATIVE
Ketones, ur: NEGATIVE mg/dL
Leukocytes,Ua: NEGATIVE
Nitrite: NEGATIVE
Specific Gravity, Urine: 1.02 (ref 1.005–1.030)
pH: 5.5 (ref 5.0–8.0)

## 2022-07-06 LAB — CBC
HCT: 37.6 % — ABNORMAL LOW (ref 39.0–52.0)
Hemoglobin: 12.2 g/dL — ABNORMAL LOW (ref 13.0–17.0)
MCH: 29 pg (ref 26.0–34.0)
MCHC: 32.4 g/dL (ref 30.0–36.0)
MCV: 89.5 fL (ref 80.0–100.0)
Platelets: 348 10*3/uL (ref 150–400)
RBC: 4.2 MIL/uL — ABNORMAL LOW (ref 4.22–5.81)
RDW: 13.9 % (ref 11.5–15.5)
WBC: 7.5 10*3/uL (ref 4.0–10.5)
nRBC: 0 % (ref 0.0–0.2)

## 2022-07-06 LAB — LIPASE, BLOOD: Lipase: 64 U/L — ABNORMAL HIGH (ref 11–51)

## 2022-07-06 MED ORDER — HYDROMORPHONE HCL 1 MG/ML IJ SOLN
1.0000 mg | Freq: Once | INTRAMUSCULAR | Status: AC
  Administered 2022-07-06: 1 mg via INTRAVENOUS
  Filled 2022-07-06: qty 1

## 2022-07-06 MED ORDER — HYDROCODONE-ACETAMINOPHEN 5-325 MG PO TABS: 1.0000 | ORAL_TABLET | ORAL | 0 refills | Status: DC | PRN

## 2022-07-06 MED ORDER — SODIUM CHLORIDE 0.9 % IV BOLUS
1000.0000 mL | Freq: Once | INTRAVENOUS | Status: AC
  Administered 2022-07-06: 1000 mL via INTRAVENOUS

## 2022-07-06 MED ORDER — IOHEXOL 350 MG/ML SOLN
160.0000 mL | Freq: Once | INTRAVENOUS | Status: AC | PRN
  Administered 2022-07-06: 160 mL via INTRAVENOUS

## 2022-07-06 NOTE — Telephone Encounter (Signed)
Spoke with pt relaying Dr. Synthia Innocent message. Pt states he has not scheduled 2 wk f/u. I provided phn # below for pt to contact [facility].   Plz place referral.

## 2022-07-06 NOTE — Telephone Encounter (Signed)
Continuity of care FYI: [name]  Patient does not need follow-up with [facility], nothing they could manage for the patient, it was an error  Patient is on his way to ED now, states he cannot manage the pain

## 2022-07-06 NOTE — Telephone Encounter (Signed)
Seen at ER with stable imaging. Discharged on hydrocodone pain med.  Plz call Monday for update on LLQ abd pain.

## 2022-07-06 NOTE — Telephone Encounter (Signed)
Noted. Will await ER evaluation.

## 2022-07-06 NOTE — ED Triage Notes (Addendum)
Patient reports recently discharge after having a hemoperitoneum requiring blood transfusions.  Reports LLQ pain that started again 3 days ago.  Patients mucous membranes appears pale at this time and is guarding his LLQ. LBM yesterday which was normal no visible blood per patient.

## 2022-07-06 NOTE — ED Provider Notes (Signed)
[name] EMERGENCY DEPT Provider Note   CSN: [id number] Arrival date & time: 07/06/22  1215     History  Chief Complaint  Patient presents with   Abdominal Pain    David Moss is a 57 y.o. male.  Patient with worsening left-sided lower abdominal pain.  He was recently discharged in the hospital in December 26 with hemoperitoneum of uncertain origin.  It was thought it occurred when he lifted a cement bench.  He was seen by interventional radiology and trauma surgery with not require any intervention.  He did receive 2 units of blood during his hospitalization.  Pain has worsened again over the past 2 to 3 days.  Denies any new fall or trauma.  Does have some generalized dizziness and lightheadedness.  No chest pain or shortness of breath.  No recent black or bloody stools.  No blood thinner use.  States the pain never really went away but became severe again several days ago but does not recall any kind of injury.  The history is provided by the patient and the spouse.  Abdominal Pain Associated symptoms: no chest pain, no cough, no dysuria, no fever, no hematuria, no nausea, no shortness of breath and no vomiting        Home Medications Prior to Admission medications   Medication Sig Start Date End Date Taking? Authorizing Provider  acetaminophen (TYLENOL) 500 MG tablet Take 2 tablets (1,000 mg total) by mouth 3 (three) times daily as needed for moderate pain. 07/03/22   Ria Bush, MD  carvedilol (COREG) 3.125 MG tablet Take 1 tablet (3.125 mg total) by mouth 2 (two) times daily with a meal. 06/26/22   Thurnell Lose, MD  cyclobenzaprine (FLEXERIL) 10 MG tablet Take 1 tablet (10 mg total) by mouth 2 (two) times daily as needed for muscle spasms (sedation precautions). 09/25/18   Ria Bush, MD  ferrous sulfate 325 (65 FE) MG tablet Take 1 tablet (325 mg total) by mouth daily with breakfast. 07/03/22   Ria Bush, MD  folic acid (FOLVITE) 1  MG tablet Take 1 tablet (1 mg total) by mouth daily. 06/26/22   Thurnell Lose, MD  pantoprazole (PROTONIX) 40 MG tablet Take 1 tablet (40 mg total) by mouth daily at 12 noon. 06/26/22   Thurnell Lose, MD  SUMAtriptan (IMITREX) 100 MG tablet Take 1 tablet (100 mg total) by mouth once for 1 dose. May repeat in 2 hours if headache persists or recurs. 09/25/18 06/23/22  Ria Bush, MD  SUMAtriptan (IMITREX) 100 MG tablet Take 100 mg by mouth every 2 (two) hours as needed for migraine. May repeat in 2 hours if headache persists or recurs.    [provider]      Allergies    Patient has no known allergies.    Review of Systems   Review of Systems  Constitutional:  Negative for activity change, appetite change and fever.  HENT:  Negative for congestion.   Respiratory:  Negative for cough, chest tightness and shortness of breath.   Cardiovascular:  Negative for chest pain.  Gastrointestinal:  Positive for abdominal pain. Negative for nausea and vomiting.  Genitourinary:  Negative for dysuria and hematuria.  Musculoskeletal:  Negative for arthralgias and myalgias.  Skin:  Negative for rash.  Neurological:  Positive for weakness. Negative for dizziness and headaches.   all other systems are negative except as noted in the HPI and PMH.    Physical Exam Updated Vital Signs BP 123/86  Pulse 70   Temp 98.8 F (37.1 C) (Oral)   Resp 18   Ht '6\' 2"'$  (1.88 m)   Wt 117.9 kg   SpO2 95%   BMI 33.38 kg/m  Physical Exam Vitals and nursing note reviewed.  Constitutional:      General: He is not in acute distress.    Appearance: He is well-developed.  HENT:     Head: Normocephalic and atraumatic.     Mouth/Throat:     Pharynx: No oropharyngeal exudate.  Eyes:     Conjunctiva/sclera: Conjunctivae normal.     Pupils: Pupils are equal, round, and reactive to light.  Neck:     Comments: No meningismus. Cardiovascular:     Rate and Rhythm: Normal rate and regular rhythm.      Heart sounds: Normal heart sounds. No murmur heard. Pulmonary:     Effort: Pulmonary effort is normal. No respiratory distress.     Breath sounds: Normal breath sounds.  Abdominal:     Palpations: Abdomen is soft.     Tenderness: There is abdominal tenderness. There is guarding. There is no rebound.     Comments: Left lower quadrant tenderness and guarding with questionable palpable mass.  Musculoskeletal:        General: No tenderness. Normal range of motion.     Cervical back: Normal range of motion and neck supple.  Skin:    General: Skin is warm.  Neurological:     Mental Status: He is alert and oriented to person, place, and time.     Cranial Nerves: No cranial nerve deficit.     Motor: No abnormal muscle tone.     Coordination: Coordination normal.     Comments:  5/5 strength throughout. CN 2-12 intact.Equal grip strength.   Psychiatric:        Behavior: Behavior normal.     ED Results / Procedures / Treatments   Labs (all labs ordered are listed, but only abnormal results are displayed) Labs Reviewed  LIPASE, BLOOD - Abnormal; Notable for the following components:      Result Value   Lipase 64 (*)    All other components within normal limits  COMPREHENSIVE METABOLIC PANEL - Abnormal; Notable for the following components:   Glucose, Bld 132 (*)    AST 14 (*)    Total Bilirubin 1.6 (*)    All other components within normal limits  CBC - Abnormal; Notable for the following components:   RBC 4.20 (*)    Hemoglobin 12.2 (*)    HCT 37.6 (*)    All other components within normal limits  URINALYSIS, ROUTINE W REFLEX MICROSCOPIC - Abnormal; Notable for the following components:   Protein, ur TRACE (*)    All other components within normal limits    EKG None  Radiology CT Angio Abd/Pel W and/or Wo Contrast  Result Date: 07/06/2022 CLINICAL DATA:  History of abdominal trauma, hemoperitoneum. Left lower quadrant pain EXAM: CTA ABDOMEN AND PELVIS WITHOUT AND WITH  CONTRAST TECHNIQUE: Multidetector CT imaging of the abdomen and pelvis was performed using the standard protocol during bolus administration of intravenous contrast. Multiplanar reconstructed images and MIPs were obtained and reviewed to evaluate the vascular anatomy. RADIATION DOSE REDUCTION: This exam was performed according to the departmental dose-optimization program which includes automated exposure control, adjustment of the mA and/or kV according to patient size and/or use of iterative reconstruction technique. CONTRAST:  131m OMNIPAQUE IOHEXOL 350 MG/ML SOLN COMPARISON:  06/25/2022 and previous FINDINGS: VASCULAR Aorta:  Mild partially calcified plaque in the infrarenal segment. No aneurysm, dissection, or stenosis. Celiac: Patent without evidence of aneurysm, dissection, vasculitis or significant stenosis. SMA: Patent without evidence of aneurysm, dissection, vasculitis or significant stenosis. Renals: Both renal arteries are patent without evidence of aneurysm, dissection, vasculitis, fibromuscular dysplasia or significant stenosis. IMA: Patent without evidence of aneurysm, dissection, vasculitis or significant stenosis. Inflow: Nonocclusive eccentric calcified plaque in the distal right common iliac artery. No aneurysm, dissection, or stenosis. Proximal Outflow: Minimally atheromatous, patent. Veins: Patent hepatic veins, portal vein, SM V, splenic vein, bilateral renal veins, iliac venous confluence and IVC. Review of the MIP images confirms the above findings. NON-VASCULAR Lower chest: No pleural or pericardial effusion. Visualized lung bases clear. Hepatobiliary: No focal liver abnormality is seen. No gallstones, gallbladder wall thickening, or biliary dilatation. Pancreas: Unremarkable. No pancreatic ductal dilatation or surrounding inflammatory changes. Spleen: Normal in size without focal abnormality. Adrenals/Urinary Tract: No adrenal mass. Symmetric renal enhancement. Stable left renal cysts  largest 2.2 cm mid pole; no follow-up recommended. No hydronephrosis. Urinary bladder partially distended. Stomach/Bowel: Stomach incompletely distended, unremarkable. Benign proximal duodenal diverticulum. Small bowel decompressed. Appendix not discretely identified. The colon is nondilated, unremarkable. Lymphatic: No abdominal or pelvic adenopathy. Reproductive: Mild prostate enlargement with central coarse calcification. Other: Small volume hyperdense fluid in the cul-de-sac as before. Some interval evolution of the previously noted loculated left anterior peritoneal hematoma. No evidence of new or active extravasation. No new fluid collections. No free air. Musculoskeletal: No acute or significant osseous findings. IMPRESSION: 1. No acute findings. 2. Some interval evolution of the previously noted loculated left anterior peritoneal hematoma. No evidence of new or active extravasation. 3. Stable small volume hyperdense fluid in the cul-de-sac. 4.  Aortic Atherosclerosis (ICD10-I70.0). Electronically Signed   By: Lucrezia Europe M.D.   On: 07/06/2022 15:19    Procedures Procedures    Medications Ordered in ED Medications  sodium chloride 0.9 % bolus 1,000 mL (1,000 mLs Intravenous New Bag/Given 07/06/22 1352)  HYDROmorphone (DILAUDID) injection 1 mg (1 mg Intravenous Given 07/06/22 1351)    ED Course/ Medical Decision Making/ A&P                           Medical Decision Making Amount and/or Complexity of Data Reviewed Labs: ordered. Decision-making details documented in ED Course. Radiology: ordered and independent interpretation performed. Decision-making details documented in ED Course. ECG/medicine tests: ordered and independent interpretation performed. Decision-making details documented in ED Course.  Risk Prescription drug management.  Left-sided abdominal tenderness with recent hemoperitoneum.  No vomiting.  Vitals are stable, no distress.  No free fluid seen on bedside  ultrasound.  Hemoglobin is stable at 12  Blood pressure and heart rate remained stable.  CTA is obtained to evaluate for recurrence of patient's hemoperitoneum.  CTA read as above show no acute findings with no new or active extravasation.  Interval improvement in the hematoma seen in his abdomen.  Hemoglobin is stable.  Additional dose of pain medications provided.  Call to trauma surgery to evaluate patient's presentation given his severe pain with negative imaging. UA negative.   D/w Dr. Bobbye Morton of trauma surgery who is familiar with patient.  She reviewed patient's itching.  She agrees no new areas of extravasation or bleeding.  Hemoglobin is stable and vital signs are stable.  Feels patient can likely be discharged with pain control and follow-up with PCP. Additional pain control pending at discharge.  Dr. Melina Copa  to assume care.  Discussed with patient reassuring results.  Follow-up with PCP for recheck next week       Final Clinical Impression(s) / ED Diagnoses Final diagnoses:  None    Rx / DC Orders ED Discharge Orders     None         Clotiel Troop, Annie Main, MD 07/06/22 1610

## 2022-07-06 NOTE — ED Provider Notes (Signed)
Signout from Dr. Wyvonnia Dusky.  57 year old male recent admission for hemoperitoneum requiring blood transfusion.  He is here with 3 days of increasing abdominal pain again.  Tenderness left side I did.  Vitals labs and CT imaging unremarkable.  Trauma surgery discussion felt was appropriate for discharge. Physical Exam  BP 117/89   Pulse 67   Temp 98.8 F (37.1 C) (Oral)   Resp 16   Ht '6\' 2"'$  (1.88 m)   Wt 117.9 kg   SpO2 90%   BMI 33.38 kg/m   Physical Exam  Procedures  Procedures  ED Course / MDM    Medical Decision Making Amount and/or Complexity of Data Reviewed Labs: ordered. Radiology: ordered.  Risk Prescription drug management.   I met with the patient and reviewed his workup with him and and trauma surgery recommendations.  He is comfortable plan for discharge.  He is asking for prescription for pain medicine because he said Tylenol is not helping.  I reviewed medication and precautions.  Recommended close follow-up with PCP and return instructions discussed.       Hayden Rasmussen, MD 07/07/22 1041

## 2022-07-06 NOTE — ED Notes (Signed)
Pain reported as decreased and at a comfort level at this time

## 2022-07-06 NOTE — ED Notes (Signed)
Provider at bedside

## 2022-07-06 NOTE — ED Notes (Signed)
Reviewed AVS/discharge instruction with patient. Time allotted for and all questions answered. Patient is agreeable for d/c and escorted to ed exit by staff.  

## 2022-07-09 ENCOUNTER — Telehealth: Payer: Self-pay | Admitting: *Deleted

## 2022-07-09 NOTE — Telephone Encounter (Signed)
See 07/09/22 phn note.

## 2022-07-09 NOTE — Telephone Encounter (Signed)
Attempted to contact pt. He picked up 2x, then disconnected call. On 3rd attempt, I lvm asking pt to call back. Need to get update on abd pain.

## 2022-07-09 NOTE — Telephone Encounter (Signed)
Transition Care Management Follow-up Telephone Call Date of discharge and from where: 07/06/2022 [facility] How have you been since you were released from the hospital? Doing pretty good on light duty Any questions or concerns? No  Items Reviewed: Did the pt receive and understand the discharge instructions provided? Yes  Medications obtained and verified? Yes  Other? No  Any new allergies since your discharge? No  Dietary orders reviewed? Yes Do you have support at home? Yes    Functional Questionnaire: (I = Independent and D = Dependent) ADLs: i  Bathing/Dressing- i  Meal Prep- i  Eating- i  Maintaining continence- i  Transferring/Ambulation- i  Managing Meds- i  Follow up appointments reviewed:  PCP Hospital f/u appt confirmed?  Patient wife will set up follow up with PCP   Are transportation arrangements needed? No  If their condition worsens, is the pt aware to call PCP or go to the Emergency Dept.? Yes Was the patient provided with contact information for the PCP's office or ED? Yes Was to pt encouraged to call back with questions or concerns? Yes

## 2022-07-09 NOTE — Telephone Encounter (Signed)
Left message on voicemail for patient to call the office back. 

## 2022-07-09 NOTE — Telephone Encounter (Signed)
Noted. Doesn't necessarily need ER f/u appt with me as I just saw him 07/03/2022 for same issue.  Would see how symptoms are doing with vicodin Rx and he can call me for pain med refill whenever he needs it - he should let me know. He may schedule appt if he desires it.

## 2022-07-10 NOTE — Telephone Encounter (Signed)
Pt called returning missed calls. Told pt Dr. Synthia Innocent response, pt stated his symptoms were better with the meds, HYDROcodone-acetaminophen (NORCO/VICODIN) 5-325 MG tablet. Pt had no questions/concerns, pt stated he was fine with holding off on a appt with Dr. Darnell Level. Call back # [phone]

## 2022-07-10 NOTE — Telephone Encounter (Signed)
Lvm asking pt to call back.  Need to relay Dr. G's message.  

## 2022-07-30 ENCOUNTER — Telehealth: Payer: Self-pay | Admitting: Family Medicine

## 2022-07-30 MED ORDER — PANTOPRAZOLE SODIUM 40 MG PO TBEC: 40.0000 mg | DELAYED_RELEASE_TABLET | Freq: Every day | ORAL | 5 refills | Status: DC

## 2022-07-30 MED ORDER — CARVEDILOL 3.125 MG PO TABS: 3.1250 mg | ORAL_TABLET | Freq: Two times a day (BID) | ORAL | 5 refills | Status: DC

## 2022-07-30 NOTE — Telephone Encounter (Signed)
Patient would like to know if husband is suppose to continue medications that were prescribed to him while he was in the hospital?He has already finished the medications,but she would like to know if he is suppose to get refills on them? She said a good time to call her would be after 430,due to her being at work.

## 2022-07-30 NOTE — Telephone Encounter (Signed)
He should continue carvedilol and pantoprazole - I've refilled to [facility]. Ok to stop flexeril and/or hydrocodone pain med or use only as needed.  Regarding iron, folic acid supplements - will await labs next week then determine need for ongoing use - these are over the counter, continue for now.   As per instructions, recommend he schedule CPE in 2-3 months and we can review meds at that time as well.

## 2022-07-31 ENCOUNTER — Telehealth: Payer: Self-pay | Admitting: *Deleted

## 2022-07-31 NOTE — Telephone Encounter (Signed)
Patient's wife Magda Paganini stated that she called pulmonology to schedule an appointment for her husband. Patient's wife stated that the scheduler at pulmonary told her that they never received a referral. It appears referral was placed 07/03/22. Patient's wife stated that she does not understand why she was told that a referral has not been done. Patient's wife stated that they would prefer an appointment with Dr. Keturah Barre if possible.

## 2022-07-31 NOTE — Telephone Encounter (Signed)
Patient's wife notified as instructed by telephone and verbalized understanding. Patient's wife stated that they will schedule his CPE when they come in for lab appointment next week.

## 2022-08-02 NOTE — Telephone Encounter (Signed)
Referral sent back to Pulmonary. They had closed the referral due to not being able to reach the patient. I have put a note on the referral to contact the patient again as they are wanting this scheduled. They will review and reach out.

## 2022-08-03 NOTE — Telephone Encounter (Signed)
Patient notified by telephone and telephone number given to patient for him to reach Pulmonary.

## 2022-08-06 ENCOUNTER — Other Ambulatory Visit (INDEPENDENT_AMBULATORY_CARE_PROVIDER_SITE_OTHER): Payer: Managed Care, Other (non HMO)

## 2022-08-06 DIAGNOSIS — K661 Hemoperitoneum: Secondary | ICD-10-CM | POA: Diagnosis not present

## 2022-08-06 LAB — CBC WITH DIFFERENTIAL/PLATELET
Basophils Absolute: 0 10*3/uL (ref 0.0–0.1)
Basophils Relative: 0.8 % (ref 0.0–3.0)
Eosinophils Absolute: 0.2 10*3/uL (ref 0.0–0.7)
Eosinophils Relative: 4.7 % (ref 0.0–5.0)
HCT: 41.5 % (ref 39.0–52.0)
Hemoglobin: 13.9 g/dL (ref 13.0–17.0)
Lymphocytes Relative: 21 % (ref 12.0–46.0)
Lymphs Abs: 1.1 10*3/uL (ref 0.7–4.0)
MCHC: 33.5 g/dL (ref 30.0–36.0)
MCV: 83.8 fl (ref 78.0–100.0)
Monocytes Absolute: 0.3 10*3/uL (ref 0.1–1.0)
Monocytes Relative: 6 % (ref 3.0–12.0)
Neutro Abs: 3.4 10*3/uL (ref 1.4–7.7)
Neutrophils Relative %: 67.5 % (ref 43.0–77.0)
Platelets: 188 10*3/uL (ref 150.0–400.0)
RBC: 4.95 Mil/uL (ref 4.22–5.81)
RDW: 15.6 % — ABNORMAL HIGH (ref 11.5–15.5)
WBC: 5.1 10*3/uL (ref 4.0–10.5)

## 2022-08-06 LAB — COMPREHENSIVE METABOLIC PANEL
ALT: 19 U/L (ref 0–53)
AST: 16 U/L (ref 0–37)
Albumin: 4.2 g/dL (ref 3.5–5.2)
Alkaline Phosphatase: 52 U/L (ref 39–117)
BUN: 16 mg/dL (ref 6–23)
CO2: 28 mEq/L (ref 19–32)
Calcium: 9.1 mg/dL (ref 8.4–10.5)
Chloride: 103 mEq/L (ref 96–112)
Creatinine, Ser: 1.07 mg/dL (ref 0.40–1.50)
GFR: 78.59 mL/min (ref >60.00)
Glucose, Bld: 145 mg/dL — ABNORMAL HIGH (ref 70–99)
Potassium: 4.3 mEq/L (ref 3.5–5.1)
Sodium: 138 mEq/L (ref 135–145)
Total Bilirubin: 1 mg/dL (ref 0.2–1.2)
Total Protein: 7.2 g/dL (ref 6.0–8.3)

## 2022-08-11 ENCOUNTER — Other Ambulatory Visit: Payer: Self-pay | Admitting: Family Medicine

## 2022-08-11 MED ORDER — FOLIC ACID 1 MG PO TABS: 1.0000 mg | ORAL_TABLET | ORAL | Status: DC

## 2022-08-11 MED ORDER — FERROUS SULFATE 325 (65 FE) MG PO TABS: 325.0000 mg | ORAL_TABLET | ORAL | Status: DC

## 2022-08-21 ENCOUNTER — Other Ambulatory Visit: Payer: Self-pay

## 2022-08-23 ENCOUNTER — Encounter: Payer: Self-pay | Admitting: Pulmonary Disease

## 2022-08-23 ENCOUNTER — Ambulatory Visit: Payer: Managed Care, Other (non HMO) | Admitting: Pulmonary Disease

## 2022-08-23 VITALS — BP 140/90 | HR 71 | Ht 74.0 in | Wt 265.0 lb

## 2022-08-23 DIAGNOSIS — Z7722 Contact with and (suspected) exposure to environmental tobacco smoke (acute) (chronic): Secondary | ICD-10-CM

## 2022-08-23 DIAGNOSIS — R911 Solitary pulmonary nodule: Secondary | ICD-10-CM | POA: Diagnosis not present

## 2022-08-23 NOTE — Progress Notes (Signed)
Synopsis: Referred in February 2024 for pulmonary nodule by Ria Bush, MD  Subjective:   PATIENT ID: David Moss GENDER: male DOB: 1965-07-26, MRN: [id number]  Chief Complaint  Patient presents with   Consult    Lung nodule    This is a 57 year old gentleman, past medical history of gastroesophageal reflux, migraines.  Patient uses smokeless tobacco quit in 2005.Patient was referred after having a CT of the chest in January 2024.  He came into the ER in December 2023 was found to have hemoperitoneum.  At that time also had a right lateral lung base subpleural 6 mm pulmonary nodule.  This was found incidentally on images and he was referred for follow-up.  Patient has no complaints today.  No family history of lung cancer.  Both mother and father were smokers so he does have some secondhand smoke exposure.    Past Medical History:  Diagnosis Date   Back pain    GERD (gastroesophageal reflux disease)    Migraine      Family History  Problem Relation Age of Onset   Asthma Mother    Migraines Mother    Stroke Mother    Colon polyps Mother    CAD Neg Hx    Cancer Neg Hx    Diabetes Neg Hx    Hypertension Neg Hx      Past Surgical History:  Procedure Laterality Date   COLONOSCOPY  05/2016   serrated adenoma and HP, diverticulosis, rpt 5 yrs Henrene Pastor)   ESOPHAGOGASTRODUODENOSCOPY  05/2016   GERD with esophagitis   EXCISION MASS NECK Left 02/26/2017   Procedure: EXCISION OF SUBCUTANEOUS MASS NECK LEFT;  Surgeon: Clyde Canterbury, MD;  Location: [facility];  Service: ENT;  Laterality: Left;   INGUINAL HERNIA REPAIR Left 2011   West Right 04/2015    Social History   Socioeconomic History   Marital status: Married    Spouse name: Not on file   Number of children: 1   Years of education: Not on file   Highest education level: Not on file  Occupational History   Occupation: landscaper  Tobacco Use   Smoking status: Former    Smokeless tobacco: Former    Types: Chew    Quit date: 07/03/2003   Tobacco comments:    15 yrs smokeless tobacco  Vaping Use   Vaping Use: Never used  Substance and Sexual Activity   Alcohol use: Yes    Alcohol/week: 1.0 standard drink of alcohol    Types: 1 Cans of beer per week    Comment: Social - 4x/yr   Drug use: No   Sexual activity: Yes    Partners: Female  Other Topics Concern   Not on file  Social History Narrative   Lives with wife and daughter, 1 dog   Occupation: Biomedical scientist, farmer   Edu: HS   Activity: stays active at work and on farm   Diet: good water, fruits/vegetables daily   Social Determinants of Health   Financial Resource Strain: Not on file  Food Insecurity: No [place] (06/23/2022)   Hunger Vital Sign    Worried About Running Out of Food in the Last Year: Never true    [place] in the Last Year: Never true  Transportation Needs: No Transportation Needs (06/23/2022)   PRAPARE - Hydrologist (Medical): No    Lack of Transportation (Non-Medical): No  Physical Activity: Not on file  Stress: Not on file  Social Connections: Not on file  Intimate Partner Violence: Not At Risk (06/23/2022)   Humiliation, Afraid, Rape, and Kick questionnaire    Fear of Current or Ex-Partner: No    Emotionally Abused: No    Physically Abused: No    Sexually Abused: No     No Known Allergies   Outpatient Medications Prior to Visit  Medication Sig Dispense Refill   acetaminophen (TYLENOL) 500 MG tablet Take 2 tablets (1,000 mg total) by mouth 3 (three) times daily as needed for moderate pain.     carvedilol (COREG) 3.125 MG tablet Take 1 tablet (3.125 mg total) by mouth 2 (two) times daily with a meal. 60 tablet 5   cyclobenzaprine (FLEXERIL) 10 MG tablet Take 1 tablet (10 mg total) by mouth 2 (two) times daily as needed for muscle spasms (sedation precautions). 30 tablet 1   ferrous sulfate 325 (65 FE) MG tablet Take 1 tablet  (325 mg total) by mouth every Monday, Wednesday, and Friday.     pantoprazole (PROTONIX) 40 MG tablet Take 1 tablet (40 mg total) by mouth daily at 12 noon. 30 tablet 5   SUMAtriptan (IMITREX) 100 MG tablet Take 100 mg by mouth every 2 (two) hours as needed for migraine. May repeat in 2 hours if headache persists or recurs.     SUMAtriptan (IMITREX) 100 MG tablet Take 1 tablet (100 mg total) by mouth once for 1 dose. May repeat in 2 hours if headache persists or recurs. 10 tablet 3   folic acid (FOLVITE) 1 MG tablet Take 1 tablet (1 mg total) by mouth every Monday, Wednesday, and Friday.     HYDROcodone-acetaminophen (NORCO/VICODIN) 5-325 MG tablet Take 1 tablet by mouth every 4 (four) hours as needed. 10 tablet 0   No facility-administered medications prior to visit.    Review of Systems  Constitutional:  Positive for weight loss (intentional). Negative for chills, fever and malaise/fatigue.  HENT:  Negative for hearing loss, sore throat and tinnitus.   Eyes:  Negative for blurred vision and double vision.  Respiratory:  Negative for cough, hemoptysis, sputum production, shortness of breath, wheezing and stridor.   Cardiovascular:  Negative for chest pain, palpitations, orthopnea, leg swelling and PND.  Gastrointestinal:  Negative for abdominal pain, constipation, diarrhea, heartburn, nausea and vomiting.  Genitourinary:  Negative for dysuria, hematuria and urgency.  Musculoskeletal:  Negative for joint pain and myalgias.  Skin:  Negative for itching and rash.  Neurological:  Negative for dizziness, tingling, weakness and headaches.  Endo/Heme/Allergies:  Negative for environmental allergies. Does not bruise/bleed easily.  Psychiatric/Behavioral:  Negative for depression. The patient is not nervous/anxious and does not have insomnia.   All other systems reviewed and are negative.    Objective:  Physical Exam Vitals reviewed.  Constitutional:      General: He is not in acute  distress.    Appearance: He is well-developed. He is obese.  HENT:     Head: Normocephalic and atraumatic.  Eyes:     General: No scleral icterus.    Conjunctiva/sclera: Conjunctivae normal.     Pupils: Pupils are equal, round, and reactive to light.  Neck:     Vascular: No JVD.     Trachea: No tracheal deviation.  Cardiovascular:     Rate and Rhythm: Normal rate and regular rhythm.     Heart sounds: Normal heart sounds. No murmur heard. Pulmonary:     Effort: Pulmonary effort is normal. No tachypnea, accessory  muscle usage or respiratory distress.     Breath sounds: No stridor. No wheezing, rhonchi or rales.  Abdominal:     General: There is no distension.     Palpations: Abdomen is soft.     Tenderness: There is no abdominal tenderness.  Musculoskeletal:        General: No tenderness.     Cervical back: Neck supple.  Lymphadenopathy:     Cervical: No cervical adenopathy.  Skin:    General: Skin is warm and dry.     Capillary Refill: Capillary refill takes less than 2 seconds.     Findings: No rash.  Neurological:     Mental Status: He is alert and oriented to person, place, and time.  Psychiatric:        Behavior: Behavior normal.      Vitals:   08/23/22 0834  BP: (!) 140/90  Pulse: 71  SpO2: 95%  Weight: 265 lb (120.2 kg)  Height: 6' 2"$  (1.88 m)   95% on RA BMI Readings from Last 3 Encounters:  08/23/22 34.02 kg/m  07/06/22 33.38 kg/m  07/03/22 33.43 kg/m   Wt Readings from Last 3 Encounters:  08/23/22 265 lb (120.2 kg)  07/06/22 260 lb (117.9 kg)  07/03/22 260 lb 6 oz (118.1 kg)     CBC    Component Value Date/Time   WBC 5.1 08/06/2022 0748   RBC 4.95 08/06/2022 0748   HGB 13.9 08/06/2022 0748   HGB 14.5 05/30/2011 0000   HCT 41.5 08/06/2022 0748   PLT 188.0 08/06/2022 0748   MCV 83.8 08/06/2022 0748   MCH 29.0 07/06/2022 1230   MCHC 33.5 08/06/2022 0748   RDW 15.6 (H) 08/06/2022 0748   LYMPHSABS 1.1 08/06/2022 0748   MONOABS 0.3  08/06/2022 0748   EOSABS 0.2 08/06/2022 0748   BASOSABS 0.0 08/06/2022 0748    Chest Imaging:  CT chest December 2023: 6 mm right lower lobe subpleural lung nodule.  The report says left but the nodules obviously on the right lung. The patient's images have been independently reviewed by me.    Pulmonary Functions Testing Results:     No data to display          FeNO:   Pathology:   Echocardiogram:   Heart Catheterization:     Assessment & Plan:     ICD-10-CM   1. Lung nodule  R91.1 CT Chest Wo Contrast    CANCELED: CT Chest Wo Contrast    2. Second hand smoke exposure  Z77.22       Discussion:  This is a 57 year old gentleman, found to have a 6 mm subpleural pulmonary nodule.  This nodule was found incidentally on a CT when he presented to the emergency room with spontaneous hemoperitoneum.  He is recovering from this they are not sure what happened they think it may be related to him picking up something that was heavy.  Plan: Repeat CT scan of the chest in January 2025. 1 year follow-up for a non-smoker with a 6 mm nodule. We talked about the fact that he had very little risk for the development of lung cancer as well as lower lobe lesions are at lower risk. We will see if this changes over time.  We document stability over 2 years and we will then be able to stop following.   Current Outpatient Medications:    acetaminophen (TYLENOL) 500 MG tablet, Take 2 tablets (1,000 mg total) by mouth 3 (three) times daily as  needed for moderate pain., Disp: , Rfl:    carvedilol (COREG) 3.125 MG tablet, Take 1 tablet (3.125 mg total) by mouth 2 (two) times daily with a meal., Disp: 60 tablet, Rfl: 5   cyclobenzaprine (FLEXERIL) 10 MG tablet, Take 1 tablet (10 mg total) by mouth 2 (two) times daily as needed for muscle spasms (sedation precautions)., Disp: 30 tablet, Rfl: 1   ferrous sulfate 325 (65 FE) MG tablet, Take 1 tablet (325 mg total) by mouth every Monday,  Wednesday, and Friday., Disp: , Rfl:    pantoprazole (PROTONIX) 40 MG tablet, Take 1 tablet (40 mg total) by mouth daily at 12 noon., Disp: 30 tablet, Rfl: 5   SUMAtriptan (IMITREX) 100 MG tablet, Take 100 mg by mouth every 2 (two) hours as needed for migraine. May repeat in 2 hours if headache persists or recurs., Disp: , Rfl:    SUMAtriptan (IMITREX) 100 MG tablet, Take 1 tablet (100 mg total) by mouth once for 1 dose. May repeat in 2 hours if headache persists or recurs., Disp: 10 tablet, Rfl: 3    Garner Nash, DO [facility] 08/23/2022 8:53 AM

## 2022-08-23 NOTE — Patient Instructions (Signed)
Thank you for visiting Dr. Valeta Harms at [facility]. Today we recommend the following:  Orders Placed This Encounter  Procedures   CT Chest Wo Contrast   Return in about 1 year (around 08/24/2023) for with Eric Form, NP, or Dr. Valeta Harms.    Please do your part to reduce the spread of COVID-19.

## 2023-05-29 ENCOUNTER — Ambulatory Visit (HOSPITAL_BASED_OUTPATIENT_CLINIC_OR_DEPARTMENT_OTHER): Payer: Managed Care, Other (non HMO)

## 2023-06-13 ENCOUNTER — Telehealth: Payer: Self-pay | Admitting: *Deleted

## 2023-06-13 NOTE — Telephone Encounter (Signed)
Transferred patient over to [facility] to get it moved up

## 2023-06-13 NOTE — Telephone Encounter (Signed)
PT lost his job. Can we resched the CT before the years end? [employer] issues)

## 2023-07-02 ENCOUNTER — Ambulatory Visit
Admission: RE | Admit: 2023-07-02 | Discharge: 2023-07-02 | Disposition: A | Discharge status: Home / Self Care | Payer: Managed Care, Other (non HMO) | Source: Ambulatory Visit | Attending: Pulmonary Disease | Admitting: Pulmonary Disease

## 2023-07-02 DIAGNOSIS — R911 Solitary pulmonary nodule: Secondary | ICD-10-CM

## 2023-07-05 ENCOUNTER — Other Ambulatory Visit: Payer: Managed Care, Other (non HMO)

## 2023-07-10 ENCOUNTER — Institutional Professional Consult (permissible substitution): Payer: Managed Care, Other (non HMO) | Admitting: Plastic Surgery

## 2023-07-16 ENCOUNTER — Ambulatory Visit: Payer: Managed Care, Other (non HMO) | Admitting: Acute Care

## 2023-07-16 ENCOUNTER — Encounter: Payer: Self-pay | Admitting: Acute Care

## 2023-07-16 VITALS — BP 128/78 | HR 74 | Temp 98.1°F | Ht 74.0 in | Wt 268.8 lb

## 2023-07-16 DIAGNOSIS — Z23 Encounter for immunization: Secondary | ICD-10-CM | POA: Diagnosis not present

## 2023-07-16 DIAGNOSIS — R911 Solitary pulmonary nodule: Secondary | ICD-10-CM

## 2023-07-16 DIAGNOSIS — R0609 Other forms of dyspnea: Secondary | ICD-10-CM

## 2023-07-16 DIAGNOSIS — I700 Atherosclerosis of aorta: Secondary | ICD-10-CM

## 2023-07-16 DIAGNOSIS — Z7722 Contact with and (suspected) exposure to environmental tobacco smoke (acute) (chronic): Secondary | ICD-10-CM

## 2023-07-16 DIAGNOSIS — Z87891 Personal history of nicotine dependence: Secondary | ICD-10-CM

## 2023-07-16 NOTE — Progress Notes (Signed)
 History of Present Illness David Moss is a 58 y.o. male former smoker with heavy second hand smoke exposure as a child.  Patient was referred in February 2024 for pulmonary nodule by PCP.   07/16/2023 58 year old gentleman, past medical history of gastroesophageal reflux, migraines. Patient uses smokeless tobacco quit in 2005.Patient was referred after having a CT of the chest in January 2024. He came into the ER in December 2023 was found to have hemoperitoneum. At that time also had a right lateral lung base subpleural 6 mm pulmonary nodule. This was found incidentally on images and he was referred for follow-up. No family history of lung cancer. Both mother and father were smokers so he does have some secondhand smoke exposure.   Test Results: CT chest without contrast 07/02/2023 Stable 6 mm subpleural right lateral lung base nodule. No further follow-up imaging is recommended. 2. Hepatic steatosis. 3. Aortic atherosclerosis.  CT chest December 2023: 6 mm right lower lobe subpleural lung nodule.  The report says left but the nodules obviously on the right lung.     Latest Ref Rng & Units 08/06/2022    7:48 AM 07/06/2022   12:30 PM 07/03/2022   12:48 PM  CBC  WBC 4.0 - 10.5 K/uL 5.1  7.5  8.8   Hemoglobin 13.0 - 17.0 g/dL 86.0  87.7  87.8   Hematocrit 39.0 - 52.0 % 41.5  37.6  36.2   Platelets 150.0 - 400.0 K/uL 188.0  348  353.0        Latest Ref Rng & Units 08/06/2022    7:48 AM 07/06/2022   12:30 PM 07/03/2022   12:48 PM  BMP  Glucose 70 - 99 mg/dL 854  867  97   Moss 6 - 23 mg/dL 16  15  17    Creatinine 0.40 - 1.50 mg/dL 8.92  8.97  8.95   Sodium 135 - 145 mEq/L 138  137  135   Potassium 3.5 - 5.1 mEq/L 4.3  4.2  4.5   Chloride 96 - 112 mEq/L 103  98  97   CO2 19 - 32 mEq/L 28  28  30    Calcium  8.4 - 10.5 mg/dL 9.1  9.7  9.7     BNP    Component Value Date/Time   BNP 90.3 06/26/2022 0036    ProBNP No results found for: PROBNP  PFT No results found for:  FEV1PRE, FEV1POST, FVCPRE, FVCPOST, TLC, DLCOUNC, PREFEV1FVCRT, PSTFEV1FVCRT  CT Chest Wo Contrast Result Date: 07/12/2023 CLINICAL DATA:  Follow-up lung nodule EXAM: CT CHEST WITHOUT CONTRAST TECHNIQUE: Multidetector CT imaging of the chest was performed following the standard protocol without IV contrast. RADIATION DOSE REDUCTION: This exam was performed according to the departmental dose-optimization program which includes automated exposure control, adjustment of the mA and/or kV according to patient size and/or use of iterative reconstruction technique. COMPARISON:  Chest CT 06/23/2022 FINDINGS: Cardiovascular: Limited assessment without intravenous contrast. Mild atherosclerosis. No aneurysm. Normal cardiac size. No pericardial effusion Mediastinum/Nodes: Patent trachea. No thyroid  mass. No suspicious lymph nodes. Esophagus within normal limits. Lungs/Pleura: No acute airspace disease, pleural effusion or pneumothorax. Bleb in the right lung base. Stable 6 mm subpleural right lateral lung base nodule on series 5, image 125. No new lung nodule Upper Abdomen: No acute finding.  Hepatic steatosis. Musculoskeletal: No acute osseous abnormality. IMPRESSION: 1. Stable 6 mm subpleural right lateral lung base nodule. No further follow-up imaging is recommended. 2. Hepatic steatosis. 3. Aortic atherosclerosis. Aortic Atherosclerosis (ICD10-I70.0).  Electronically Signed   By: David Moss M.D.   On: 07/12/2023 23:50     Past medical hx Past Medical History:  Diagnosis Date   Back pain    GERD (gastroesophageal reflux disease)    Migraine      Social History   Tobacco Use   Smoking status: Former    Current packs/day: 0.25    Average packs/day: 0.3 packs/day for 25.0 years (6.3 ttl pk-yrs)    Types: Cigarettes   Smokeless tobacco: Former    Types: Chew    Quit date: 07/03/2003   Tobacco comments:    Quit 2000  Vaping Use   Vaping status: Never Used  Substance Use Topics    Alcohol use: Yes    Alcohol/week: 1.0 standard drink of alcohol    Types: 1 Cans of beer per week    Comment: Social - 4x/yr   Drug use: No    David Moss reports that he has quit smoking. His smoking use included cigarettes. He has a 6.3 pack-year smoking history. He quit smokeless tobacco use about 20 years ago.  His smokeless tobacco use included chew. He reports current alcohol use of about 1.0 standard drink of alcohol per week. He reports that he does not use drugs.  Tobacco Cessation: Former smoker quit 2000 with current smokeless tobacco use  Past surgical hx, Family hx, Social hx all reviewed.  Current Outpatient Medications on File Prior to Visit  Medication Sig   acetaminophen  (TYLENOL ) 500 MG tablet Take 2 tablets (1,000 mg total) by mouth 3 (three) times daily as needed for moderate pain.   cyclobenzaprine  (FLEXERIL ) 10 MG tablet Take 1 tablet (10 mg total) by mouth 2 (two) times daily as needed for muscle spasms (sedation precautions).   SUMAtriptan  (IMITREX ) 100 MG tablet Take 100 mg by mouth every 2 (two) hours as needed for migraine. May repeat in 2 hours if headache persists or recurs.   SUMAtriptan  (IMITREX ) 100 MG tablet Take 1 tablet (100 mg total) by mouth once for 1 dose. May repeat in 2 hours if headache persists or recurs.   No current facility-administered medications on file prior to visit.     No Known Allergies  Review Of Systems:  Constitutional:   No  weight loss, night sweats,  Fevers, chills, fatigue, or  lassitude.  HEENT:   No headaches,  Difficulty swallowing,  Tooth/dental problems, or  Sore throat,                No sneezing, itching, ear ache, nasal congestion, post nasal drip,   CV:  No chest pain,  Orthopnea, PND, swelling in lower extremities, anasarca, dizziness, palpitations, syncope.   GI  No heartburn, indigestion, abdominal pain, nausea, vomiting, diarrhea, change in bowel habits, loss of appetite, bloody stools.   Resp: No shortness of  breath with exertion or at rest.  No excess mucus, no productive cough,  No non-productive cough,  No coughing up of blood.  No change in color of mucus.  No wheezing.  No chest wall deformity  Skin: no rash or lesions.  GU: no dysuria, change in color of urine, no urgency or frequency.  No flank pain, no hematuria   MS:  No joint pain or swelling.  No decreased range of motion.  No back pain.  Psych:  No change in mood or affect. No depression or anxiety.  No memory loss.   Vital Signs BP 128/78 (BP Location: Left Arm, Cuff Size: Large)  Pulse 74   Temp 98.1 F (36.7 C) (Oral)   Ht 6' 2 (1.88 m)   Wt 268 lb 12.8 oz (121.9 kg)   SpO2 100%   BMI 34.51 kg/m    Physical Exam:  General- No distress,  A&Ox3, pleasant and appropriate ENT: No sinus tenderness, TM clear, pale nasal mucosa, no oral exudate,no post nasal drip, no LAN Cardiac: S1, S2, regular rate and rhythm, no murmur Chest: No wheeze/ rales/ dullness; no accessory muscle use, no nasal flaring, no sternal retractions, slightly diminished per bases Abd.: Soft Non-tender, nondistended, bowel sounds positive,Body mass index is 34.51 kg/m.  Ext: No clubbing cyanosis, edema Neuro:  normal strength, moving all extremities x 4 alert and oriented x 3 Skin: No rashes, warm and dry, no obvious lesions Psych: normal mood and behavior   Assessment/Plan Stable 6 mm subpleural right lateral lung base nodule Per radiology no further imaging is recommended Plan Your lung nodule that we have been following is stable, and per radiology, you do not additional follow up. If you develop unintentional weight loss or blood in your sputum, please call to be seen.   Aortic atherosclerosis noted on CT chest Family history of coronary artery disease in mother Elevated lipid panel last collected in 2017 Patient is not on statin Plan As you have a first degree relative with heart disease ( your mother) you should be followed by  cardiology, and have labs checked regularly. Please have your PCP refer to cardiology.  Dyspnea on exertion Plan We will order Pulmonary function tests to see if we can determine why you get short of breath.  You will get a call to get this scheduled.  Follow-up with me after pulmonary function tests to review results  Healthcare maintenance Plan Flu shot today   I spent 30 minutes dedicated to the care of this patient on the date of this encounter to include pre-visit review of records, face-to-face time with the patient discussing conditions above, post visit ordering of testing, clinical documentation with the electronic health record, making appropriate referrals as documented, and communicating necessary information to the patient's healthcare team.  Lauraine JULIANNA Lites, NP 07/16/2023  10:10 AM

## 2023-07-16 NOTE — Patient Instructions (Addendum)
 It is good to see you today. Your lung nodule that we have been following is stable, and per radiology, you do not additional follow up. If you develop unintentional weight loss or blood in your sputum, please call to be seen.  Your CT Chest also showed aortic atherosclerosis. As you have a first degree relative with heart disease ( your mother) you should be followed by cardiology, and have labs checked regularly. Please have your PCP refer to cardiology. We will order Pulmonary function tests to see if we can determine why you get short of breath.  You will get a call to get this scheduled.  Flu shot today Please contact office for sooner follow up if symptoms do not improve or worsen or seek emergency care

## 2023-07-19 ENCOUNTER — Telehealth: Payer: Self-pay | Admitting: Family Medicine

## 2023-07-19 ENCOUNTER — Telehealth: Payer: Self-pay | Admitting: Acute Care

## 2023-07-19 NOTE — Telephone Encounter (Signed)
Spoke with pt offering OV. Pt declines stating it will be cheaper with his insurance to get cards referral via pulmonology. Offered to schedule CPE with Dr Reece Agar since he hasn't been seen in a yr. Pt declines at this time but states he will call back to schedule CPE.

## 2023-07-19 NOTE — Telephone Encounter (Signed)
Patient would like a referral to the cardiologist. His PCP was unable to do so without him having an appointment. Patient and wife (on [facility] like a call back to discuss some of their options. Call back number [phone] or [phone]

## 2023-07-19 NOTE — Telephone Encounter (Signed)
Copied from CRM [id number]. Topic: Referral - Question >> Jul 19, 2023  8:46 AM Pascal Lux wrote: Reason for CRM:  Patient wife called stated patient had an appointment with a pulmonologist and they stated patient needs to see a cardiologist and to contact his primary care provider for a referral.

## 2023-07-25 NOTE — Telephone Encounter (Signed)
I called and spoke with the pt  He states that his PCP will not refer to cards unless he comes in for appt  He wonders if since he was just seen here recent, if we would do the referral  Sarah, please advise, thanks!

## 2023-07-25 NOTE — Telephone Encounter (Signed)
David Moss wife checking on referral to Cardiologist. David Moss phone number is [phone] or [phone].

## 2023-07-26 ENCOUNTER — Other Ambulatory Visit: Payer: Self-pay | Admitting: Acute Care

## 2023-07-26 DIAGNOSIS — I700 Atherosclerosis of aorta: Secondary | ICD-10-CM

## 2023-07-29 ENCOUNTER — Encounter: Payer: Self-pay | Admitting: *Deleted

## 2023-07-29 NOTE — Telephone Encounter (Signed)
Sarah placed referral- I have sent mychart msg letting him know.

## 2023-08-01 NOTE — Telephone Encounter (Signed)
PT's wife called upset that no one called her per the instructions on the initial,signed tel encounter. I adv her msg was to call "PT or Wife " and we did speak to the Pt. 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 I apologized and then gave her the referral information. She balked and said "And those weren't the two Dr.'s I talked to her about and that was why I wanted a call back! I had great confidence in Maralyn Sago but now I am beginning to question your front staff." She hung up still upset.   [facility] has been out of order since Monday so she did not get the [facility] message sent and her husband is not on [facility], she added.)

## 2023-08-05 NOTE — Telephone Encounter (Signed)
Please refer to 07/19/2023 phone note.  Lm for patient's spouse, David Moss (not listed on DPR for [facility]) will need verbal from pt.

## 2023-08-20 ENCOUNTER — Ambulatory Visit: Payer: Managed Care, Other (non HMO) | Admitting: Acute Care

## 2023-08-20 ENCOUNTER — Ambulatory Visit (HOSPITAL_BASED_OUTPATIENT_CLINIC_OR_DEPARTMENT_OTHER): Payer: Managed Care, Other (non HMO) | Admitting: Emergency Medicine

## 2023-08-20 DIAGNOSIS — R0609 Other forms of dyspnea: Secondary | ICD-10-CM

## 2023-08-20 LAB — PULMONARY FUNCTION TEST
DL/VA % pred: 111 %
DL/VA: 4.75 ml/min/mmHg/L
DLCO cor % pred: 112 %
DLCO cor: 34.96 ml/min/mmHg
DLCO unc % pred: 112 %
DLCO unc: 34.96 ml/min/mmHg
FEF 25-75 Post: 2.71 L/s
FEF 25-75 Pre: 2.26 L/s
FEF2575-%Change-Post: 19 %
FEF2575-%Pred-Post: 77 %
FEF2575-%Pred-Pre: 64 %
FEV1-%Change-Post: 7 %
FEV1-%Pred-Post: 85 %
FEV1-%Pred-Pre: 80 %
FEV1-Post: 3.58 L
FEV1-Pre: 3.34 L
FEV1FVC-%Change-Post: 9 %
FEV1FVC-%Pred-Pre: 87 %
FEV6-%Change-Post: -1 %
FEV6-%Pred-Post: 93 %
FEV6-%Pred-Pre: 95 %
FEV6-Post: 4.88 L
FEV6-Pre: 4.97 L
FEV6FVC-%Change-Post: 0 %
FEV6FVC-%Pred-Post: 103 %
FEV6FVC-%Pred-Pre: 103 %
FVC-%Change-Post: -1 %
FVC-%Pred-Post: 90 %
FVC-%Pred-Pre: 91 %
FVC-Post: 4.9 L
FVC-Pre: 4.98 L
Post FEV1/FVC ratio: 73 %
Post FEV6/FVC ratio: 100 %
Pre FEV1/FVC ratio: 67 %
Pre FEV6/FVC Ratio: 100 %
RV % pred: 135 %
RV: 3.12 L
TLC % pred: 106 %
TLC: 8.04 L

## 2023-08-20 NOTE — Progress Notes (Signed)
 Full PFT Performed Today

## 2023-08-20 NOTE — Patient Instructions (Signed)
 Full PFT Performed Today

## 2023-09-17 ENCOUNTER — Encounter: Payer: Self-pay | Admitting: Acute Care

## 2023-09-17 ENCOUNTER — Ambulatory Visit: Payer: Managed Care, Other (non HMO) | Admitting: Acute Care

## 2023-09-17 VITALS — BP 138/80 | HR 69 | Ht 73.0 in | Wt 271.8 lb

## 2023-09-17 DIAGNOSIS — J449 Chronic obstructive pulmonary disease, unspecified: Secondary | ICD-10-CM

## 2023-09-17 DIAGNOSIS — I2510 Atherosclerotic heart disease of native coronary artery without angina pectoris: Secondary | ICD-10-CM

## 2023-09-17 DIAGNOSIS — Z87891 Personal history of nicotine dependence: Secondary | ICD-10-CM | POA: Diagnosis not present

## 2023-09-17 DIAGNOSIS — R0609 Other forms of dyspnea: Secondary | ICD-10-CM

## 2023-09-17 MED ORDER — BREZTRI AEROSPHERE 160-9-4.8 MCG/ACT IN AERO: 2.0000 | INHALATION_SPRAY | Freq: Two times a day (BID) | RESPIRATORY_TRACT | Status: DC

## 2023-09-17 MED ORDER — ALBUTEROL SULFATE HFA 108 (90 BASE) MCG/ACT IN AERS: 2.0000 | INHALATION_SPRAY | Freq: Four times a day (QID) | RESPIRATORY_TRACT | 2 refills | Status: AC | PRN

## 2023-09-17 NOTE — Progress Notes (Signed)
 History of Present Illness David Moss is a 58 y.o. male former smoker with heavy second hand smoke exposure as a child.  Patient was referred in February 2024 for pulmonary nodule by PCP.   Synopsis 58 year old gentleman, past medical history of gastroesophageal reflux, migraines. Patient uses smokeless tobacco quit in 2005.Patient was referred after having a CT of the chest in January 2024. He came into the ER in December 2023 was found to have hemoperitoneum. At that time also had a right lateral lung base subpleural 6 mm pulmonary nodule. This was found incidentally on images and he was referred for follow-up. No family history of lung cancer. Both mother and father were smokers so he does have some secondhand smoke exposure.  Lung nodule has been stable and per radiology no follow up required. He does have CAD on scan and a significant family history of heart disease, so I referred to cardiology. We ordered PFT's to evaluate his dyspnea. He is here to review PFT results.   09/17/2023 Pt. Presents for follow up after PFT's. He states he has been doing well.  We have reviewed the most recent pulmonary function tests.  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  He is a former smoker quit 25 years ago.  As he does experience exertional dyspnea we will start on maintenance inhaler.  Patient has been using his father's albuterol for his exertional dyspnea, I will prescribe an albuterol inhaler for breakthrough shortness of breath. I will provide patient with samples of Breztri as he prefers mist over powder.  This will be a therapeutic trial, if he feels the medication is helping him we will prescribe.  Patient had also been referred by me to cardiology at his last office visit as he has significant coronary artery disease on his CT imaging.  He has not received a call from cardiology.  I will rerefer to cardiology and list his wife's phone number as contact.  Patient is often unavailable  to answer the phone and this may be the issue with no follow-up to cardiology.  We will do follow-up in 6 months to ensure patient is doing well. I have asked the patient to call the office if he likes the [place] and we will send in a prescription.    Test Results:               Latest Ref Rng & Units 08/06/2022    7:48 AM 07/06/2022   12:30 PM 07/03/2022   12:48 PM  CBC  WBC 4.0 - 10.5 K/uL 5.1  7.5  8.8   Hemoglobin 13.0 - 17.0 g/dL 40.9  81.1  91.4   Hematocrit 39.0 - 52.0 % 41.5  37.6  36.2   Platelets 150.0 - 400.0 K/uL 188.0  348  353.0        Latest Ref Rng & Units 08/06/2022    7:48 AM 07/06/2022   12:30 PM 07/03/2022   12:48 PM  BMP  Glucose 70 - 99 mg/dL 782  956  97   BUN 6 - 23 mg/dL 16  15  17    Creatinine 0.40 - 1.50 mg/dL 2.13  0.86  5.78   Sodium 135 - 145 mEq/L 138  137  135   Potassium 3.5 - 5.1 mEq/L 4.3  4.2  4.5   Chloride 96 - 112 mEq/L 103  98  97   CO2 19 - 32 mEq/L 28  28  30    Calcium 8.4 -  10.5 mg/dL 9.1  9.7  9.7     BNP    Component Value Date/Time   BNP 90.3 06/26/2022 0036    ProBNP No results found for: "PROBNP"  PFT    Component Value Date/Time   FEV1PRE 3.34 08/20/2023 1423   FEV1POST 3.58 08/20/2023 1423   FVCPRE 4.98 08/20/2023 1423   FVCPOST 4.90 08/20/2023 1423   TLC 8.04 08/20/2023 1423   DLCOUNC 34.96 08/20/2023 1423   PREFEV1FVCRT 67 08/20/2023 1423   PSTFEV1FVCRT 73 08/20/2023 1423    No results found.   Past medical hx Past Medical History:  Diagnosis Date   Back pain    GERD (gastroesophageal reflux disease)    Migraine      Social History   Tobacco Use   Smoking status: Former    Current packs/day: 0.00    Types: Cigarettes    Quit date: 2000    Years since quitting: 25.2    Passive exposure: Past   Smokeless tobacco: Former    Types: Chew    Quit date: 07/03/2003   Tobacco comments:    Quit 2000  Vaping Use   Vaping status: Never Used  Substance Use Topics   Alcohol use: Yes     Alcohol/week: 1.0 standard drink of alcohol    Types: 1 Cans of beer per week    Comment: Social - 4x/yr   Drug use: No    [name] reports that he quit smoking about 25 years ago. His smoking use included cigarettes. He has been exposed to tobacco smoke. He quit smokeless tobacco use about 20 years ago.  His smokeless tobacco use included chew. He reports current alcohol use of about 1.0 standard drink of alcohol per week. He reports that he does not use drugs.  Tobacco Cessation: Counseling given: Not Answered Tobacco comments: Quit 2000 Former smoker quit 25 years ago  Past surgical hx, Family hx, Social hx all reviewed.  Current Outpatient Medications on File Prior to Visit  Medication Sig   cyclobenzaprine (FLEXERIL) 10 MG tablet Take 1 tablet (10 mg total) by mouth 2 (two) times daily as needed for muscle spasms (sedation precautions).   SUMAtriptan (IMITREX) 100 MG tablet Take 100 mg by mouth every 2 (two) hours as needed for migraine. May repeat in 2 hours if headache persists or recurs.   acetaminophen (TYLENOL) 500 MG tablet Take 2 tablets (1,000 mg total) by mouth 3 (three) times daily as needed for moderate pain.   No current facility-administered medications on file prior to visit.     No Known Allergies  Review Of Systems:  Constitutional:   No  weight loss, night sweats,  Fevers, chills, fatigue, or  lassitude.  HEENT:   No headaches,  Difficulty swallowing,  Tooth/dental problems, or  Sore throat,                No sneezing, itching, ear ache, nasal congestion, post nasal drip,   CV:  No chest pain,  Orthopnea, PND, swelling in lower extremities, anasarca, dizziness, palpitations, syncope.   GI  No heartburn, indigestion, abdominal pain, nausea, vomiting, diarrhea, change in bowel habits, loss of appetite, bloody stools.   Resp: + shortness of breath with exertion or at rest.  + excess mucus, + productive cough,  No non-productive cough,  No coughing up of blood.   No change in color of mucus.  Occasional  wheezing.  No chest wall deformity  Skin: no rash or lesions.  GU: no dysuria, change  in color of urine, no urgency or frequency.  No flank pain, no hematuria   MS:  No joint pain or swelling.  No decreased range of motion.  No back pain.  Psych:  No change in mood or affect. No depression or anxiety.  No memory loss.   Vital Signs BP 138/80 (BP Location: Left Arm, Patient Position: Sitting, Cuff Size: Large)   Pulse 69   Ht 6\' 1"  (1.854 m)   Wt 271 lb 12.8 oz (123.3 kg)   SpO2 98%   BMI 35.86 kg/m    Physical Exam:  General- No distress,  A&Ox3, pleasant ENT: No sinus tenderness, TM clear, pale nasal mucosa, no oral exudate,no post nasal drip, no LAN Cardiac: S1, S2, regular rate and rhythm, no murmur Chest: No wheeze/ rales/ dullness; no accessory muscle use, no nasal flaring, no sternal retractions, slightly diminished per bases. Abd.: Soft Non-tender, ND, BS +, Body mass index is 35.86 kg/m.  Ext: No clubbing cyanosis, edema Neuro:  normal strength, MAE x 4, A&O x 3 Skin: No rashes, warm and dry Psych: normal mood and behavior   Assessment/Plan New diagnosis mild COPD based on PFT Former smoker quit 25 years ago Normal DLCO Plan Your PFT's do show some mild COPD.  I will give you samples of Breztri to see if you like it, and can tell a difference in your breathing. Take 2 puffs in the morning, 2 puffs in the evening. Every day without fail. Rinse mouth after each use. I will also send in a prescription for albuterol inhaler.  Use as rescue for breakthrough shortness of breath or wheezing . Call if you like the [facility], and I will send in a prescription.  Note your daily symptoms > remember "red flags" for COPD:  Increase in cough, increase in sputum production, increase in shortness of breath or activity intolerance. If you notice these symptoms, please call to be seen.  Follow up in 2 months to see how you are doing.   Please contact office for sooner follow up if symptoms do not improve or worsen or seek emergency care      CAD Plan I will re-refer you to cardiology.  I will have them call Mose Colaizzi at [phone] to schedule.  I spent 25 minutes dedicated to the care of this patient on the date of this encounter to include pre-visit review of records, face-to-face time with the patient discussing conditions above, post visit ordering of testing, clinical documentation with the electronic health record, making appropriate referrals as documented, and communicating necessary information to the patient's healthcare team.  Bevelyn Ngo, NP 09/17/2023  8:41 AM

## 2023-09-17 NOTE — Patient Instructions (Addendum)
 It is good to see you today. Your PFT's do show some mild COPD.  I will give you samples of Breztri to see if you like it, and can tell a difference in your breathing. Take 2 puffs in the morning, 2 puffs in the evening. Every day without fail. Rinse mouth after each use. I will also send in a prescription for albuterol inhaler.  Use as rescue for breakthrough shortness of breath or wheezing . Call if you like the [facility], and I will send in a prescription.  I will re-refer you to cardiology.  I will have them call Slate Debroux at [phone] to schedule. Note your daily symptoms > remember "red flags" for COPD:  Increase in cough, increase in sputum production, increase in shortness of breath or activity intolerance. If you notice these symptoms, please call to be seen.  Follow up in 2 months to see how you are doing.  Please contact office for sooner follow up if symptoms do not improve or worsen or seek emergency care

## 2023-11-15 ENCOUNTER — Ambulatory Visit: Payer: Managed Care, Other (non HMO) | Attending: Internal Medicine | Admitting: Internal Medicine

## 2023-11-15 ENCOUNTER — Encounter: Payer: Self-pay | Admitting: Internal Medicine

## 2023-11-15 VITALS — BP 138/94 | HR 63 | Ht 74.0 in | Wt 271.4 lb

## 2023-11-15 DIAGNOSIS — Z9189 Other specified personal risk factors, not elsewhere classified: Secondary | ICD-10-CM | POA: Diagnosis not present

## 2023-11-15 DIAGNOSIS — I700 Atherosclerosis of aorta: Secondary | ICD-10-CM | POA: Diagnosis not present

## 2023-11-15 DIAGNOSIS — E66811 Obesity, class 1: Secondary | ICD-10-CM | POA: Diagnosis not present

## 2023-11-15 DIAGNOSIS — I2510 Atherosclerotic heart disease of native coronary artery without angina pectoris: Secondary | ICD-10-CM | POA: Diagnosis not present

## 2023-11-15 NOTE — Progress Notes (Signed)
 OFFICE CONSULT NOTE  Chief Complaint:  Aortic atherosclerosis  Primary Care Physician: Claire Crick, MD  HPI:  David Moss is a 58 y.o. male who is being seen today for the evaluation of aortic atherosclerosis at the request of Raejean Bullock, NP.  This is a pleasant 58 year old male kindly referred from pulmonary for imaging evidence of aortic atherosclerosis.  He has been followed there due to history of smoking and family history of sort.  He was found to have a pulmonary nodule and this has been followed closely.  On CT chest he was noted to have aortic atherosclerosis.  He was also noted to have some hepatic steatosis.  I personally reviewed his CT of the chest and noted that there is some coronary artery calcification which was not reported, primarily in the LAD but also in the left circumflex and mildly in the right coronary.  He has not had any lipid assessment at least since 2017 from what I can see.  He is not currently on any lipid-lowering medications or blood pressure medicines.  Blood pressure was minimally elevated today 138/94.    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  He says he sleeps only a couple hours at night.  When he does fall asleep sometimes during the day he is noted to snore heavily by his wife.  He is also obese with a thick neck and there is high likelihood of sleep apnea.  He does not note any history of heart disease in the family.  He reports he is not aware of his biological father's medical history.  PMHx:  Past Medical History:  Diagnosis Date   Aortic atherosclerosis (HCC)    Back pain    COPD (chronic obstructive pulmonary disease) (HCC)    Dyspnea    Ex-smoker    Family history of heart disease    GERD (gastroesophageal reflux disease)    Hemoperitoneum    Migraine    Pulmonary nodule    SOB (shortness of breath) on exertion     Past Surgical History:  Procedure Laterality Date   COLONOSCOPY  05/2016   serrated  adenoma and HP, diverticulosis, rpt 5 yrs Elvin Hammer)   ESOPHAGOGASTRODUODENOSCOPY  05/2016   GERD with esophagitis   EXCISION MASS NECK Left 02/26/2017   Procedure: EXCISION OF SUBCUTANEOUS MASS NECK LEFT;  Surgeon: Von Grumbling, MD;  Location: [facility];  Service: ENT;  Laterality: Left;   INGUINAL HERNIA REPAIR Left 2011   [name]   MENISCUS REPAIR Right 04/2015    FAMHx:  Family History  Problem Relation Age of Onset   Asthma Mother    Migraines Mother    Stroke Mother    Colon polyps Mother    CAD Neg Hx    Cancer Neg Hx    Diabetes Neg Hx    Hypertension Neg Hx     SOCHx:   reports that he quit smoking about 25 years ago. His smoking use included cigarettes. He has been exposed to tobacco smoke. He quit smokeless tobacco use about 20 years ago.  His smokeless tobacco use included chew. He reports current alcohol use of about 1.0 standard drink of alcohol per week. He reports that he does not use drugs.  ALLERGIES:  No Known Allergies  ROS: Pertinent items noted in HPI and remainder of comprehensive ROS otherwise negative.  HOME MEDS: Current Outpatient Medications on File Prior to Visit  Medication Sig Dispense Refill  albuterol  (VENTOLIN  HFA) 108 (90 Base) MCG/ACT inhaler Inhale 2 puffs into the lungs every 6 (six) hours as needed for wheezing or shortness of breath. 8.5 g 2   budeson-glycopyrrolate -formoterol (BREZTRI  AEROSPHERE) 160-9-4.8 MCG/ACT AERO Inhale 2 puffs into the lungs in the morning and at bedtime.     cyclobenzaprine  (FLEXERIL ) 10 MG tablet Take 1 tablet (10 mg total) by mouth 2 (two) times daily as needed for muscle spasms (sedation precautions). 30 tablet 1   SUMAtriptan  (IMITREX ) 100 MG tablet Take 100 mg by mouth every 2 (two) hours as needed for migraine. May repeat in 2 hours if headache persists or recurs.     No current facility-administered medications on file prior to visit.    LABS/IMAGING: No results found for this or any previous  visit (from the past 48 hours). No results found.  LIPID PANEL:    Component Value Date/Time   CHOL 208 (H) 02/24/2016 1050   CHOL 174 05/30/2011 0000   TRIG 73.0 02/24/2016 1050   TRIG 107 05/30/2011 0000   HDL 57.50 02/24/2016 1050   CHOLHDL 4 02/24/2016 1050   VLDL 14.6 02/24/2016 1050   LDLCALC 136 (H) 02/24/2016 1050   LDLCALC 116 05/30/2011 0000    WEIGHTS: Wt Readings from Last 3 Encounters:  11/15/23 271 lb 6.4 oz (123.1 kg)  09/17/23 271 lb 12.8 oz (123.3 kg)  08/20/23 267 lb (121.1 kg)    VITALS: BP (!) 138/94 (BP Location: Left Arm, Patient Position: Sitting, Cuff Size: Large)   Pulse 63   Ht 6\' 2"  (1.88 m)   Wt 271 lb 6.4 oz (123.1 kg)   SpO2 96%   BMI 34.85 kg/m   EXAM: General appearance: alert, no distress, and moderately obese Neck: no carotid bruit, no JVD, thyroid  not enlarged, symmetric, no tenderness/mass/nodules, and thick neck Lungs: clear to auscultation bilaterally Heart: regular rate and rhythm, S1, S2 normal, no murmur, click, rub or gallop Abdomen: soft, non-tender; bowel sounds normal; no masses,  no organomegaly Extremities: extremities normal, atraumatic, no cyanosis or edema Pulses: 2+ and symmetric Skin: Skin color, texture, turgor normal. No rashes or lesions Neurologic: Grossly normal Psych: Pleasant  EKG: EKG Interpretation Date/Time:  Friday Nov 15 2023 08:20:42 EDT Ventricular Rate:  63 PR Interval:  152 QRS Duration:  104 QT Interval:  406 QTC Calculation: 415 R Axis:   -54  Text Interpretation: Normal sinus rhythm Incomplete right bundle branch block Left anterior fascicular block Minimal voltage criteria for LVH, may be normal variant ( R in aVL ) When compared with ECG of 23-Jun-2022 06:35, No significant change since last tracing Confirmed by Dinah Franco [phone]) on 11/15/2023 8:30:45 AM    ASSESSMENT: Aortic atherosclerosis and coronary artery calcification, goal LDL <70 At risk for obstructive sleep apnea -stop  bang score of 6 Moderate obesity COPD Abnormal EKG  PLAN: 1.   [name] has aortic atherosclerosis and coronary artery calcification on CT imaging of the chest for a pulmonary nodule.  He also has history of COPD and an abnormal EKG with incomplete right bundle branch block I think related to either COPD or possibly undiagnosed sleep apnea.  He is noted to heavily snore and have apneic episodes which were witnessed and generally only gets a few hours of sleep at night but his fatigue during the day.  He is able to work through it though.  He would benefit from a home sleep study.  His STOP-BANG score is 6.  Will order an [facility]  home sleep study.  Additionally, would like to update his lipids including NMR and LP(a).  He may need therapy to try to target LDL to less than 70.  Coronary artery calcification was also noted on CT of the chest.  He remains asymptomatic.  I will contact him with the results of his sleep study as well as his lipids and I suspect he will need to start on statin therapy to target his goal LDL to less than 70.  We also stressed the importance of healthy diet low in saturated fats and weight loss.  Thanks again for the kind referral.  Hazle Lites, MD, [facility], FNLA, FACP  [place]  [facility]  Medical Director of the Advanced Lipid Disorders &  Cardiovascular Risk Reduction Clinic Diplomate of the American Board of Clinical Lipidology Attending Cardiologist  Direct Dial: [phone]  Fax: [phone]  Website:  [email or web address] 11/15/2023, 8:46 AM

## 2023-11-15 NOTE — Patient Instructions (Signed)
 Medication Instructions:  Your physician recommends that you continue on your current medications as directed. Please refer to the Current Medication list given to you today.  *If you need a refill on your cardiac medications before your next appointment, please call your pharmacy*  Lab Work: FASTING Lipids- NMR  If you have labs (blood work) drawn today and your tests are completely normal, you will receive your results only by: MyChart Message (if you have MyChart) OR A paper copy in the mail If you have any lab test that is abnormal or we need to change your treatment, we will call you to review the results.  Testing/Procedures: WatchPAT?  Is a FDA cleared portable home sleep study test that uses a watch and 3 points of contact to monitor 7 different channels, including your heart rate, oxygen saturations, body position, snoring, and chest motion.  The study is easy to use from the comfort of your own home and accurately detect sleep apnea.  Before bed, you attach the chest sensor, attached the sleep apnea bracelet to your nondominant hand, and attach the finger probe.  After the study, the raw data is downloaded from the watch and scored for apnea events.   For more information: [email or web address]  Patient Testing Instructions:  Do not put battery into the device until bedtime when you are ready to begin the test. Please call the support number if you need assistance after following the instructions below: 24 hour support line- [phone] or [facility] support at [phone] (option 2)  Download the IntelWatchPAT One" app through the google play store or App Store  Be sure to turn on or enable access to bluetooth in settlings on your smartphone/ device  Make sure no other bluetooth devices are on and within the vicinity of your smartphone/ device and WatchPAT watch during testing.  Make sure to leave your smart phone/ device plugged in and charging all night.  When  ready for bed:  Follow the instructions step by step in the WatchPAT One App to activate the testing device. For additional instructions, including video instruction, visit the WatchPAT One video on Youtube. You can search for WatchPat One within Youtube (video is 4 minutes and 18 seconds) or enter: https://youtube/watch?[email or web address]_vbiwxE Please note: You will be prompted to enter a Pin to connect via bluetooth when starting the test. The PIN will be assigned to you when you receive the test.  The device is disposable, but it recommended that you retain the device until you receive a call letting you know the study has been received and the results have been interpreted.  We will let you know if the study did not transmit to us  properly after the test is completed. You do not need to call us  to confirm the receipt of the test.  Please complete the test within 48 hours of receiving PIN.   Frequently Asked Questions:  What is Watch Deatra Face one?  A single use fully disposable home sleep apnea testing device and will not need to be returned after completion.  What are the requirements to use WatchPAT one?  The be able to have a successful watchpat one sleep study, you should have your Watch pat one device, your smart phone, watch pat one app, your PIN number and Internet access What type of phone do I need?  You should have a smart phone that uses Android 5.1 and above or any Iphone with IOS 10 and above How can I download the WatchPAT one app?  Based on your device type search for WatchPAT one app either in google play for android devices or APP store for Iphone's Where will I get my PIN for the study?  Your PIN will be provided by your physician's office. It is used for authentication and if you lose/forget your PIN, please reach out to your providers office.  I do not have Internet at home. Can I do WatchPAT one study?  WatchPAT One needs Internet connection throughout the night to be able to transmit the  sleep data. You can use your home/local internet or your cellular's data package. However, it is always recommended to use home/local Internet. It is estimated that between 20MB-30MB will be used with each study.However, the application will be looking for space in the phone to start the study.  What happens if I lose internet or bluetooth connection?  During the internet disconnection, your phone will not be able to transmit the sleep data. All the data, will be stored in your phone. As soon as the internet connection is back on, the phone will being sending the sleep data. During the bluetooth disconnection, WatchPAT one will not be able to to send the sleep data to your phone. Data will be kept in the WatchPAT one until two devices have bluetooth connection back on. As soon as the connection is back on, WatchPAT one will send the sleep data to the phone.  How long do I need to wear the WatchPAT one?  After you start the study, you should wear the device at least 6 hours.  How far should I keep my phone from the device?  During the night, your phone should be within 15 feet.  What happens if I leave the room for restroom or other reasons?  Leaving the room for any reason will not cause any problem. As soon as your get back to the room, both devices will reconnect and will continue to send the sleep data. Can I use my phone during the sleep study?  Yes, you can use your phone as usual during the study. But it is recommended to put your watchpat one on when you are ready to go to bed.  How will I get my study results?  A soon as you completed your study, your sleep data will be sent to the provider. They will then share the results with you when they are ready.     Follow-Up: At [facility], you and your health needs are our priority.  As part of our continuing mission to provide you with exceptional heart care, our providers are all part of one team.  This team includes your primary  Cardiologist (physician) and Advanced Practice Providers or APPs (Physician Assistants and Nurse Practitioners) who all work together to provide you with the care you need, when you need it.  Your next appointment:   After labs and sleep study with Dr. Maximo Spar

## 2023-11-19 ENCOUNTER — Encounter: Payer: Self-pay | Admitting: Acute Care

## 2023-11-19 ENCOUNTER — Ambulatory Visit (INDEPENDENT_AMBULATORY_CARE_PROVIDER_SITE_OTHER): Admitting: Acute Care

## 2023-11-19 VITALS — BP 145/99 | HR 63 | Ht 74.0 in | Wt 270.6 lb

## 2023-11-19 DIAGNOSIS — I2510 Atherosclerotic heart disease of native coronary artery without angina pectoris: Secondary | ICD-10-CM | POA: Diagnosis not present

## 2023-11-19 DIAGNOSIS — Z87891 Personal history of nicotine dependence: Secondary | ICD-10-CM

## 2023-11-19 DIAGNOSIS — R911 Solitary pulmonary nodule: Secondary | ICD-10-CM

## 2023-11-19 DIAGNOSIS — R0609 Other forms of dyspnea: Secondary | ICD-10-CM | POA: Diagnosis not present

## 2023-11-19 NOTE — Progress Notes (Signed)
 History of Present Illness David Moss is a 58 y.o. male former smoker with heavy second hand smoke exposure as a child. Patient was referred in February 2024 for pulmonary nodule by PCP.   Synopsis 58 year old gentleman, past medical history of gastroesophageal reflux, migraines. Patient uses smokeless tobacco quit in 2005.Patient was referred after having a CT of the chest in January 2024. He came into the ER in December 2023 was found to have hemoperitoneum. At that time also had a right lateral lung base subpleural 6 mm pulmonary nodule. This was found incidentally on images and he was referred for follow-up. No family history of lung cancer. Both mother and father were smokers so he does have some secondhand smoke exposure.  Lung nodule has been stable and per radiology no follow up required. He does have CAD on scan and a significant family history of heart disease, so I referred to cardiology. We ordered PFT's to evaluate his dyspnea, which showed mild COPD. He was started on Breztri . He is here today to see if he feels any improvement in his dyspnea on maintenance.      11/19/2023 Patient presents for follow-up after initiation of Breztri  as maintenance inhaler for shortness of breath in setting of COPD.  Patient states he has not been using the medication daily as prescribed however does use it as he feels he needs it.  We had a long discussion regarding the fact that Breztri  is a maintenance inhaler and its to be used daily without fail regardless of how you feel.  Albuterol  is his rescue medication to be used as needed for breakthrough shortness of breath.  Patient verbalized understanding and stated he will try and do better because he does feel the medication is very helpful and he does note a difference in his shortness of breath.  Patient and his wife are supposed to check with their insurance company to see whether Breztri  or Trelegy is better covered and let me know so I can send  in a prescription.  Patient did get in to see cardiology after we referred him.  Lab work is being completed and then he will follow-up to review the results.  The 6 mm subpleural right lateral lung base nodule noted in the ER at follow-up in January 2025 per radiology did not need additional follow-up.  I have told the patient if he develops any unintentional weight loss or blood in his sputum that I would like him to call to be seen.  I have also let him know if he would like to follow the nodule with annual CT scans, we are happy to write those orders and follow that.      Test Results: CT chest without contrast 07/02/2023 Stable 6 mm subpleural right lateral lung base nodule. No further follow-up imaging is recommended. 2. Hepatic steatosis. 3. Aortic atherosclerosis.   CT chest December 2023: 6 mm right lower lobe subpleural lung nodule.  The report says left but the nodules obviously on the right lung.      Latest Ref Rng & Units 08/06/2022    7:48 AM 07/06/2022   12:30 PM 07/03/2022   12:48 PM  CBC  WBC 4.0 - 10.5 K/uL 5.1  7.5  8.8   Hemoglobin 13.0 - 17.0 g/dL 09.8  11.9  14.7   Hematocrit 39.0 - 52.0 % 41.5  37.6  36.2   Platelets 150.0 - 400.0 K/uL 188.0  348  353.0  Latest Ref Rng & Units 08/06/2022    7:48 AM 07/06/2022   12:30 PM 07/03/2022   12:48 PM  BMP  Glucose 70 - 99 mg/dL 161  096  97   BUN 6 - 23 mg/dL 16  15  17    Creatinine 0.40 - 1.50 mg/dL 0.45  4.09  8.11   Sodium 135 - 145 mEq/L 138  137  135   Potassium 3.5 - 5.1 mEq/L 4.3  4.2  4.5   Chloride 96 - 112 mEq/L 103  98  97   CO2 19 - 32 mEq/L 28  28  30    Calcium 8.4 - 10.5 mg/dL 9.1  9.7  9.7     BNP    Component Value Date/Time   BNP 90.3 06/26/2022 0036    ProBNP No results found for: "PROBNP"  PFT    Component Value Date/Time   FEV1PRE 3.34 08/20/2023 1423   FEV1POST 3.58 08/20/2023 1423   FVCPRE 4.98 08/20/2023 1423   FVCPOST 4.90 08/20/2023 1423   TLC 8.04 08/20/2023 1423    DLCOUNC 34.96 08/20/2023 1423   PREFEV1FVCRT 67 08/20/2023 1423   PSTFEV1FVCRT 73 08/20/2023 1423    No results found.   Past medical hx Past Medical History:  Diagnosis Date   Aortic atherosclerosis (HCC)    Back pain    COPD (chronic obstructive pulmonary disease) (HCC)    Coronary artery calcification    Dyspnea    Ex-smoker    Family history of heart disease    GERD (gastroesophageal reflux disease)    Hemoperitoneum    Migraine    Pulmonary nodule    SOB (shortness of breath) on exertion      Social History   Tobacco Use   Smoking status: Former    Current packs/day: 0.00    Types: Cigarettes    Quit date: 2000    Years since quitting: 25.4    Passive exposure: Past   Smokeless tobacco: Former    Types: Chew    Quit date: 07/03/2003   Tobacco comments:    Quit 2000  Vaping Use   Vaping status: Never Used  Substance Use Topics   Alcohol use: Yes    Alcohol/week: 1.0 standard drink of alcohol    Types: 1 Cans of beer per week    Comment: Social - 4x/yr   Drug use: No    David Moss reports that he quit smoking about 25 years ago. His smoking use included cigarettes. He has been exposed to tobacco smoke. He quit smokeless tobacco use about 20 years ago.  His smokeless tobacco use included chew. He reports current alcohol use of about 1.0 standard drink of alcohol per week. He reports that he does not use drugs.  Tobacco Cessation: Counseling given: Not Answered Tobacco comments: Quit 2000 Former smoker quit in 2020 significant secondhand smoke exposure  Past surgical hx, Family hx, Social hx all reviewed.  Current Outpatient Medications on File Prior to Visit  Medication Sig   albuterol  (VENTOLIN  HFA) 108 (90 Base) MCG/ACT inhaler Inhale 2 puffs into the lungs every 6 (six) hours as needed for wheezing or shortness of breath.   budeson-glycopyrrolate -formoterol (BREZTRI  AEROSPHERE) 160-9-4.8 MCG/ACT AERO Inhale 2 puffs into the lungs in the morning and at  bedtime.   cyclobenzaprine  (FLEXERIL ) 10 MG tablet Take 1 tablet (10 mg total) by mouth 2 (two) times daily as needed for muscle spasms (sedation precautions).   SUMAtriptan  (IMITREX ) 100 MG tablet Take 100 mg by mouth  every 2 (two) hours as needed for migraine. May repeat in 2 hours if headache persists or recurs.   No current facility-administered medications on file prior to visit.     No Known Allergies  Review Of Systems:  Constitutional:   No  weight loss, night sweats,  Fevers, chills, fatigue, or  lassitude.  HEENT:   No headaches,  Difficulty swallowing,  Tooth/dental problems, or  Sore throat,                No sneezing, itching, ear ache, nasal congestion, post nasal drip,   CV:  No chest pain,  Orthopnea, PND, swelling in lower extremities, anasarca, dizziness, palpitations, syncope.   GI  No heartburn, indigestion, abdominal pain, nausea, vomiting, diarrhea, change in bowel habits, loss of appetite, bloody stools.   Resp: No shortness of breath with exertion or at rest.  No excess mucus, no productive cough,  No non-productive cough,  No coughing up of blood.  No change in color of mucus.  No wheezing.  No chest wall deformity  Skin: no rash or lesions.  GU: no dysuria, change in color of urine, no urgency or frequency.  No flank pain, no hematuria   MS:  No joint pain or swelling.  No decreased range of motion.  No back pain.  Psych:  No change in mood or affect. No depression or anxiety.  No memory loss.   Vital Signs BP (!) 145/99 (BP Location: Left Arm, Patient Position: Sitting, Cuff Size: Large)   Pulse 63   Ht 6\' 2"  (1.88 m)   Wt 270 lb 9.6 oz (122.7 kg)   SpO2 96%   BMI 34.74 kg/m    Physical Exam:  General- No distress,  A&Ox3 ENT: No sinus tenderness, TM clear, pale nasal mucosa, no oral exudate,no post nasal drip, no LAN Cardiac: S1, S2, regular rate and rhythm, no murmur Chest: No wheeze/ rales/ dullness; no accessory muscle use, no nasal  flaring, no sternal retractions Abd.: Soft Non-tender Ext: No clubbing cyanosis, edema Neuro:  normal strength Skin: No rashes, warm and dry Psych: normal mood and behavior   Assessment/Plan Dyspnea on exertion Mild COPD per PFT Plan Remember Breztri  is an every day maintenance medication. Take every day without fail. Rinse mouth after use. Continue Albuterol  as rescue as you have been doing. Note your daily symptoms > remember "red flags" for COPD:  Increase in cough, increase in sputum production, increase in shortness of breath or activity intolerance. If you notice these symptoms, please call to be seen. Follow up with insurance on which drug is preferred Breztri  or Trelegy. Follow up in 6 months or sooner as needed.    6 mm pulmonary nodule Stable on last imaging January 2025 Per radiology no further follow-up needed Former smoker with significant secondhand smoke exposure Plan Please call to be seen if you develop any hemoptysis or unintentional weight loss Please let us  know if you would like to follow this nodule annually to ensure no growth  Coronary artery disease on CT Referred to cardiology March 2025 Significant family history of coronary artery disease Plan Follow-up with cardiology as is scheduled to review lab work   I spent 20 minutes dedicated to the care of this patient on the date of this encounter to include pre-visit review of records, face-to-face time with the patient discussing conditions above, post visit ordering of testing, clinical documentation with the electronic health record, making appropriate referrals as documented, and communicating necessary information to  the patient's healthcare team.     Raejean Bullock, NP 11/19/2023  8:32 AM

## 2023-11-19 NOTE — Patient Instructions (Signed)
 It is good to see you today. Remember Breztri  is an every day maintenance medication. Take every day without fail. Rinse mouth after use. Continue Albuterol  as rescue as you have been doing. Follow up with cardiology as planned  Follow up with insurance on which drug is preferred Breztri  or Trelegy. Follow up in 6 months or sooner as needed.  Note your daily symptoms > remember "red flags" for COPD:  Increase in cough, increase in sputum production, increase in shortness of breath or activity intolerance. If you notice these symptoms, please call to be seen.

## 2023-11-30 LAB — NMR, LIPOPROFILE
Cholesterol, Total: 234 mg/dL — ABNORMAL HIGH (ref 100–199)
HDL Particle Number: 37.5 umol/L (ref >=30.5)
HDL-C: 48 mg/dL (ref >39)
LDL Particle Number: 1846 nmol/L — ABNORMAL HIGH (ref <1000)
LDL Size: 21.3 nm (ref >20.5)
LDL-C (NIH Calc): 160 mg/dL — ABNORMAL HIGH (ref 0–99)
LP-IR Score: 81 — ABNORMAL HIGH (ref <=45)
Small LDL Particle Number: 528 nmol/L — ABNORMAL HIGH (ref <=527)
Triglycerides: 146 mg/dL (ref 0–149)

## 2023-12-04 ENCOUNTER — Ambulatory Visit: Payer: Self-pay | Admitting: *Deleted

## 2023-12-04 DIAGNOSIS — I700 Atherosclerosis of aorta: Secondary | ICD-10-CM

## 2024-01-02 MED ORDER — ROSUVASTATIN CALCIUM 20 MG PO TABS: 20.0000 mg | ORAL_TABLET | Freq: Every day | ORAL | 3 refills | Status: AC

## 2024-07-06 ENCOUNTER — Telehealth: Payer: Self-pay

## 2024-07-06 MED ORDER — BREZTRI AEROSPHERE 160-9-4.8 MCG/ACT IN AERO: 2.0000 | INHALATION_SPRAY | Freq: Two times a day (BID) | RESPIRATORY_TRACT | 5 refills | Status: AC

## 2024-07-06 NOTE — Telephone Encounter (Signed)
 Copied from CRM #[id number]. Topic: Clinical - Prescription Issue >> Jun 30, 2024  2:17 PM Corean SAUNDERS wrote: Reason for CRM: Patients wife Sonny is requesting a call back as they do not know if Lauraine wants the patient to be taking Breztri  or the alternative inhaler. Patients Breztri  sample is almost gone and needs to get a refill as soon as possible. Please call Sonny back at [phone] >> Jul 03, 2024  4:15 PM Leila BROCKS wrote: Patient's wife Sonny [phone] states called on 06/30/24, which medications should patient take is it Breztri  inhaler or another alternative inhaler from NP, Groce. Informed Sonny, 11/19/23 office visit: Follow up with insurance on which drug is preferred Breztri  or Trelegy. Sonny will contact [employer], and asked if patient does not answer to contact her; updated in demographics. Sonny would like to schedule a follow up appointment with NP, Groce for patient. Please call back.   Called patient wife listed on dpr,scheduled patient to be seen in feb by lauraine will send in script for breztri ,NFN

## 2024-08-04 ENCOUNTER — Ambulatory Visit: Admitting: Acute Care

## 2024-08-07 ENCOUNTER — Ambulatory Visit: Admitting: Acute Care

## 2024-08-07 ENCOUNTER — Encounter: Payer: Self-pay | Admitting: Acute Care

## 2024-08-07 VITALS — BP 131/87 | HR 65 | Temp 99.0°F | Ht 74.0 in | Wt 255.4 lb

## 2024-08-07 DIAGNOSIS — I2510 Atherosclerotic heart disease of native coronary artery without angina pectoris: Secondary | ICD-10-CM

## 2024-08-07 DIAGNOSIS — J449 Chronic obstructive pulmonary disease, unspecified: Secondary | ICD-10-CM

## 2024-08-07 NOTE — Patient Instructions (Addendum)
 It is good to see you today.  I am glad you have done well on the Breztri . Continue taking 2 puffs in the morning and 2 puffs in the  evening. Rinse mouth after use  Use albuterol  as needed for break thorough shortness of breath or wheezing. Please follow up with Dr. Katharina office regarding your Home Sleep Study and follow up appointment to review labs drawn in 10/2023. I have also sent a message to Dr. Mona.  Follow up in 6 months to ensure you are still doing well.  Call us  if you need us .  Congratulations on your weight loss, keep it up.  Please contact office for sooner follow up if symptoms do not improve or worsen or seek emergency care

## 2024-08-07 NOTE — Progress Notes (Signed)
 "  History of Present Illness David Moss is a 59 y.o. male former smoker with heavy second hand smoke exposure as a child. Patient was referred in February 2024 for pulmonary nodule by PCP.    08/07/2024 Discussed the use of AI scribe software for clinical note transcription with the patient, who gave verbal consent to proceed.   Synopsis 59 year old gentleman, former smoker ( Quit 2000) past medical history of gastroesophageal reflux, migraines. Patient uses smokeless tobacco quit in 2005.Patient was referred after having a CT of the chest in January 2024. He came into the ER in December 2023 was found to have hemoperitoneum. At that time also had a right lateral lung base subpleural 6 mm pulmonary nodule. This was found incidentally on images and he was referred for follow-up. No family history of lung cancer. Both mother and father were smokers so he does have some secondhand smoke exposure.  Lung nodule has been stable and per radiology no follow up required. He does have CAD on scan and a significant family history of heart disease, so I referred to cardiology. We ordered PFT's to evaluate his dyspnea. They showed Mild COPD. We stared him on Breztri . He is here for a 6 month follow up to see how he is doing.  Pt. Was seen by cardiology 11/18/2024. Dr. Mona ordered a home sleep study and lab work. He has not yet had the sleep study.      History of Present Illness Pt. With mild COPD presents for 6 month follow up after starting Breztri . He is here with his wife David Moss.    He has been using Breztri , two puffs in the morning and two in the evening, which he finds effective. Albuterol  has been used for breakthrough shortness of breath only twice since the last visit. Dusty conditions at work exacerbate his symptoms.  He recalls seeing Dr. Mona, a cardiologist, in May, but has not received follow-up regarding labs or a sleep study that were discussed during that visit. Pt. Had a  STOP-BANG score of 6.  I have reached out to Dr. Katharina office , and I have encouraged the patient and his wife to call to make sure he has follow up, and that the sleep study is scheduled.  He has been working on weight loss and has successfully lost about 25 pounds since he was here last. He and David Moss are trying to improve his lifestyle habits.  We will follow up in 6 months to ensure he is doing well during the summer months, which tend to affect his COPD more. Both patient and David Moss verbalized understanding and had no further questions at completion of the visit.        Test Results: CT chest without contrast 07/02/2023 Stable 6 mm subpleural right lateral lung base nodule. No further follow-up imaging is recommended. 2. Hepatic steatosis. 3. Aortic atherosclerosis.   CT chest December 2023: 6 mm right lower lobe subpleural lung nodule.  The report says left but the nodules obviously on the right lung.     Latest Ref Rng & Units 08/06/2022    7:48 AM 07/06/2022   12:30 PM 07/03/2022   12:48 PM  CBC  WBC 4.0 - 10.5 K/uL 5.1  7.5  8.8   Hemoglobin 13.0 - 17.0 g/dL 86.0  87.7  87.8   Hematocrit 39.0 - 52.0 % 41.5  37.6  36.2   Platelets 150.0 - 400.0 K/uL 188.0  348  353.0  Latest Ref Rng & Units 08/06/2022    7:48 AM 07/06/2022   12:30 PM 07/03/2022   12:48 PM  BMP  Glucose 70 - 99 mg/dL 854  867  97   BUN 6 - 23 mg/dL 16  15  17    Creatinine 0.40 - 1.50 mg/dL 8.92  8.97  8.95   Sodium 135 - 145 mEq/L 138  137  135   Potassium 3.5 - 5.1 mEq/L 4.3  4.2  4.5   Chloride 96 - 112 mEq/L 103  98  97   CO2 19 - 32 mEq/L 28  28  30    Calcium  8.4 - 10.5 mg/dL 9.1  9.7  9.7     BNP    Component Value Date/Time   BNP 90.3 06/26/2022 0036    ProBNP No results found for: PROBNP  PFT    Component Value Date/Time   FEV1PRE 3.34 08/20/2023 1423   FEV1POST 3.58 08/20/2023 1423   FVCPRE 4.98 08/20/2023 1423   FVCPOST 4.90 08/20/2023 1423   TLC 8.04 08/20/2023 1423    DLCOUNC 34.96 08/20/2023 1423   PREFEV1FVCRT 67 08/20/2023 1423   PSTFEV1FVCRT 73 08/20/2023 1423    No results found.   Past medical hx Past Medical History:  Diagnosis Date   Aortic atherosclerosis    Back pain    COPD (chronic obstructive pulmonary disease) (HCC)    Coronary artery calcification    Dyspnea    Ex-smoker    Family history of heart disease    GERD (gastroesophageal reflux disease)    Hemoperitoneum    Migraine    Pulmonary nodule    SOB (shortness of breath) on exertion      Social History[1]  David Moss reports that he quit smoking about 26 years ago. His smoking use included cigarettes. He has been exposed to tobacco smoke. He quit smokeless tobacco use about 21 years ago.  His smokeless tobacco use included chew. He reports current alcohol use of about 1.0 standard drink of alcohol per week. He reports that he does not use drugs.  Tobacco Cessation: Counseling given: Not Answered Tobacco comments: Quit 2000 Former smoker , Quit 2005  Past surgical hx, Family hx, Social hx all reviewed.  Current Outpatient Medications on File Prior to Visit  Medication Sig   albuterol  (VENTOLIN  HFA) 108 (90 Base) MCG/ACT inhaler Inhale 2 puffs into the lungs every 6 (six) hours as needed for wheezing or shortness of breath.   budesonide-glycopyrrolate -formoterol (BREZTRI  AEROSPHERE) 160-9-4.8 MCG/ACT AERO inhaler Inhale 2 puffs into the lungs in the morning and at bedtime.   cyclobenzaprine  (FLEXERIL ) 10 MG tablet Take 1 tablet (10 mg total) by mouth 2 (two) times daily as needed for muscle spasms (sedation precautions).   rosuvastatin  (CRESTOR ) 20 MG tablet Take 1 tablet (20 mg total) by mouth daily.   SUMAtriptan  (IMITREX ) 100 MG tablet Take 100 mg by mouth every 2 (two) hours as needed for migraine. May repeat in 2 hours if headache persists or recurs.   No current facility-administered medications on file prior to visit.     Allergies[2]  Review Of  Systems:  Constitutional:   No  weight loss, night sweats,  Fevers, chills, fatigue, or  lassitude.  HEENT:   No headaches,  Difficulty swallowing,  Tooth/dental problems, or  Sore throat,                No sneezing, itching, ear ache, nasal congestion, post nasal drip,   CV:  No chest pain,  Orthopnea, PND, swelling in lower extremities, anasarca, dizziness, palpitations, syncope.   GI  No heartburn, indigestion, abdominal pain, nausea, vomiting, diarrhea, change in bowel habits, loss of appetite, bloody stools.   Resp: + shortness of breath with exertion or at rest.  No excess mucus, no productive cough,  No non-productive cough,  No coughing up of blood.  No change in color of mucus.  No wheezing.  No chest wall deformity  Skin: no rash or lesions.  GU: no dysuria, change in color of urine, no urgency or frequency.  No flank pain, no hematuria   MS:  No joint pain or swelling.  No decreased range of motion.  No back pain.  Psych:  No change in mood or affect. No depression or anxiety.  No memory loss.   Vital Signs BP 131/87   Pulse 65   Temp 99 F (37.2 C) (Oral)   Ht 6' 2 (1.88 m)   Wt 255 lb 6.4 oz (115.8 kg)   SpO2 96%   BMI 32.79 kg/m   Physical Exam:  Physical Exam GENERAL: No distress, alert and oriented times 3. EARS NOSE THROAT: No sinus tenderness, tympanic membranes clear, pale nasal mucosa, no oral exudate, no post nasal drip, no lymphadenopathy. CHEST: Lungs clear to auscultation bilaterally. No wheeze, rales, dullness, no accessory muscle use, no nasal flaring, no sternal retractions. CARDIAC: S1, S2, regular rate and rhythm, no murmur. ABDOMINAL: Soft, non tender. ND, BS present. EXTREMITIES: No clubbing, cyanosis, edema. No obvious deformities. NEUROLOGICAL: Normal strength. Alert and oriented x 3, MAE x 4. SKIN: No rashes, warm and dry. No obvious skin lesions. PSYCHIATRIC: Normal mood and behavior.   Assessment/Plan Assessment & Plan Chronic  Obstructive Pulmonary Disease (COPD) Mild COPD with exertional dyspnea, well-controlled with Breztri . Minimal use of Albuterol  indicates effective control. - Continue Breztri  Aerosphere 2 puffs in the morning and at bedtime. - Use Albuterol  as needed for breakthrough symptoms. - Ensure mouth rinsing after Breztri  use. - Scheduled follow-up in 6 months to monitor COPD management.  Aortic atherosclerosis and coronary artery calcification, goal LDL <70 At risk for obstructive sleep apnea -stop bang score of 6 Moderate obesity Abnormal EKG Plan Follow up with Dr. Mona for sleep study and follow up on lab work. Message sent to Dr. Mona via Epic   I spent 20 minutes dedicated to the care of this patient on the date of this encounter to include pre-visit review of records, face-to-face time with the patient discussing conditions above, post visit ordering of testing, clinical documentation with the electronic health record, making appropriate referrals as documented, and communicating necessary information to the patient's healthcare team.      David JULIANNA Lites, NP 08/07/2024  11:46 AM             [1]  Social History Tobacco Use   Smoking status: Former    Current packs/day: 0.00    Types: Cigarettes    Quit date: 2000    Years since quitting: 26.1    Passive exposure: Past   Smokeless tobacco: Former    Types: Chew    Quit date: 07/03/2003   Tobacco comments:    Quit 2000  Vaping Use   Vaping status: Never Used  Substance Use Topics   Alcohol use: Yes    Alcohol/week: 1.0 standard drink of alcohol    Types: 1 Cans of beer per week    Comment: Social - 4x/yr   Drug use: No  [2] No Known Allergies  "
